# Patient Record
Sex: Male | Born: 1935
Health system: Southern US, Community
[De-identification: ages and names within clinical notes are randomized; demographics above are authoritative.]

## PROBLEM LIST (undated history)

## (undated) DIAGNOSIS — Z8679 Personal history of other diseases of the circulatory system: Secondary | ICD-10-CM

## (undated) DIAGNOSIS — Z9989 Dependence on other enabling machines and devices: Secondary | ICD-10-CM

## (undated) DIAGNOSIS — C4490 Unspecified malignant neoplasm of skin, unspecified: Secondary | ICD-10-CM

## (undated) DIAGNOSIS — I213 ST elevation (STEMI) myocardial infarction of unspecified site: Secondary | ICD-10-CM

## (undated) DIAGNOSIS — Z973 Presence of spectacles and contact lenses: Secondary | ICD-10-CM

## (undated) DIAGNOSIS — K219 Gastro-esophageal reflux disease without esophagitis: Secondary | ICD-10-CM

## (undated) DIAGNOSIS — G4733 Obstructive sleep apnea (adult) (pediatric): Secondary | ICD-10-CM

## (undated) DIAGNOSIS — I10 Essential (primary) hypertension: Secondary | ICD-10-CM

## (undated) DIAGNOSIS — E119 Type 2 diabetes mellitus without complications: Secondary | ICD-10-CM

## (undated) DIAGNOSIS — N400 Enlarged prostate without lower urinary tract symptoms: Secondary | ICD-10-CM

## (undated) DIAGNOSIS — I440 Atrioventricular block, first degree: Secondary | ICD-10-CM

## (undated) DIAGNOSIS — K5909 Other constipation: Secondary | ICD-10-CM

## (undated) DIAGNOSIS — E785 Hyperlipidemia, unspecified: Secondary | ICD-10-CM

## (undated) DIAGNOSIS — I2510 Atherosclerotic heart disease of native coronary artery without angina pectoris: Secondary | ICD-10-CM

## (undated) DIAGNOSIS — Z87898 Personal history of other specified conditions: Secondary | ICD-10-CM

## (undated) DIAGNOSIS — M1990 Unspecified osteoarthritis, unspecified site: Secondary | ICD-10-CM

## (undated) DIAGNOSIS — I4510 Unspecified right bundle-branch block: Secondary | ICD-10-CM

## (undated) HISTORY — PX: CORONARY ANGIOPLASTY WITH STENT PLACEMENT: SHX49

## (undated) HISTORY — PX: COLONOSCOPY: SHX174

## (undated) HISTORY — DX: Essential (primary) hypertension: I10

## (undated) HISTORY — PX: TONSILLECTOMY: SUR1361

## (undated) HISTORY — PX: SKIN CANCER EXCISION: SHX779

## (undated) HISTORY — DX: Atherosclerotic heart disease of native coronary artery without angina pectoris: I25.10

## (undated) HISTORY — PX: CARDIAC CATHETERIZATION: SHX172

---

## 1997-10-26 ENCOUNTER — Other Ambulatory Visit: Admission: RE | Admit: 1997-10-26 | Discharge: 1997-10-26 | Payer: Self-pay | Admitting: Family Medicine

## 2002-09-20 ENCOUNTER — Encounter: Payer: Self-pay | Admitting: Internal Medicine

## 2002-09-20 ENCOUNTER — Ambulatory Visit (HOSPITAL_COMMUNITY): Admission: RE | Admit: 2002-09-20 | Discharge: 2002-09-20 | Payer: Self-pay | Admitting: Internal Medicine

## 2002-09-20 HISTORY — PX: CARDIOVASCULAR STRESS TEST: SHX262

## 2006-02-28 ENCOUNTER — Inpatient Hospital Stay (HOSPITAL_COMMUNITY): Admission: EM | Admit: 2006-02-28 | Discharge: 2006-03-04 | Payer: Self-pay | Admitting: *Deleted

## 2006-02-28 DIAGNOSIS — I213 ST elevation (STEMI) myocardial infarction of unspecified site: Secondary | ICD-10-CM

## 2006-02-28 HISTORY — DX: ST elevation (STEMI) myocardial infarction of unspecified site: I21.3

## 2007-07-31 ENCOUNTER — Encounter: Admission: RE | Admit: 2007-07-31 | Discharge: 2007-07-31 | Payer: Self-pay | Admitting: Family Medicine

## 2008-08-25 IMAGING — US US ABDOMEN COMPLETE
1 series · 14 of 25 positions shown · non-contrast
Comparison: none

CLINICAL DATA: Low platelet count.  Evaluate for splenomegaly. 
 ABDOMEN ULTRASOUND:
TECHNIQUE: Complete abdominal ultrasound examination was performed including evaluation of the liver, gallbladder, bile ducts, pancreas, kidneys, spleen, IVC, and abdominal aorta.

[Series 1: unknown · 0.27mm/px · 14 of 81 slices shown]
[im 1/81]
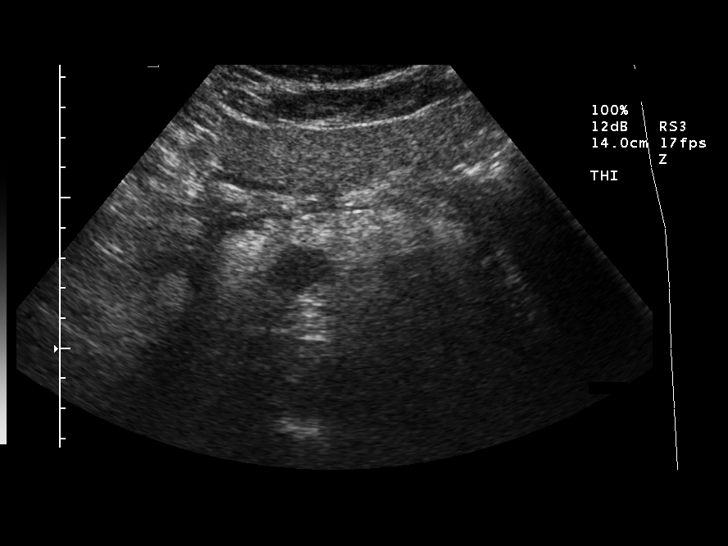
[im 7/81]
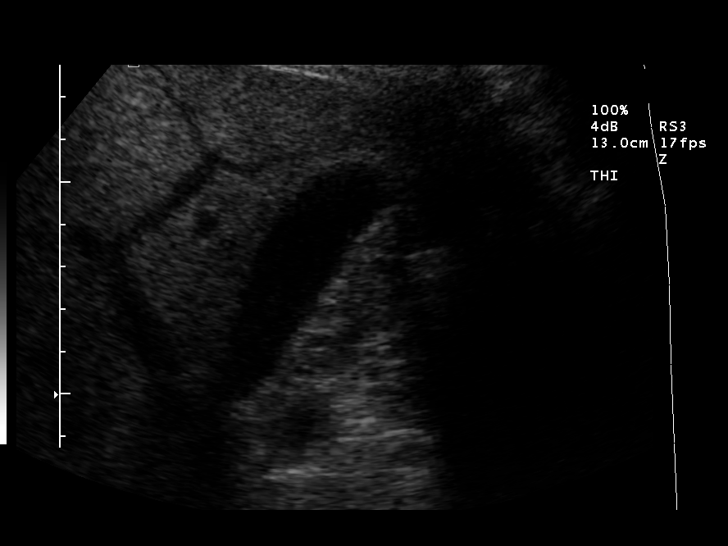
[im 14/81]
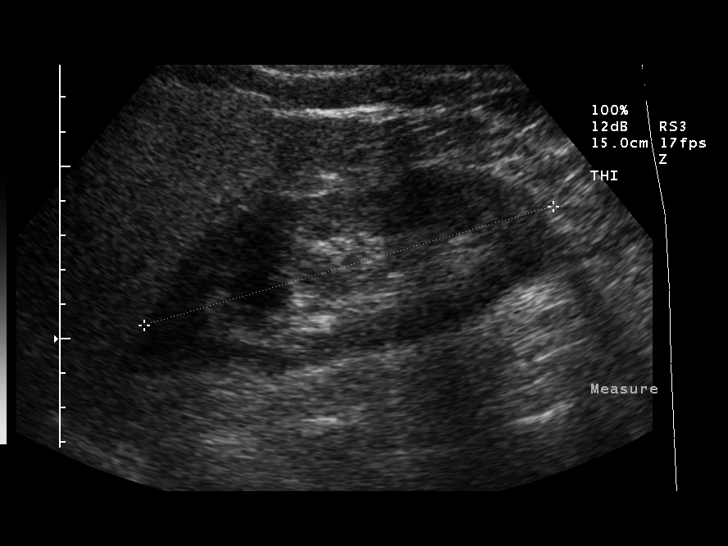
[im 21/81]
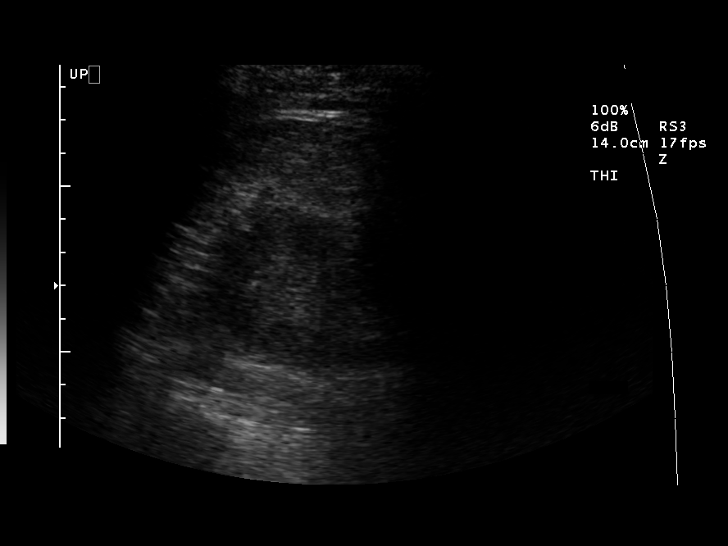
[im 27/81]
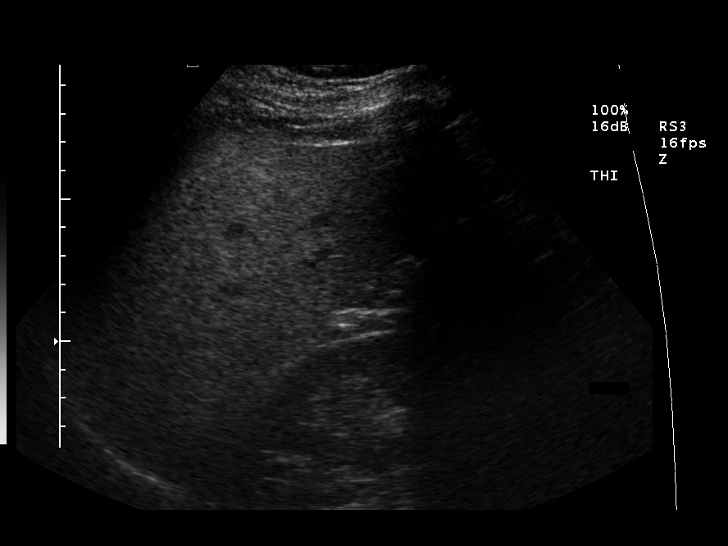
[im 31/81]
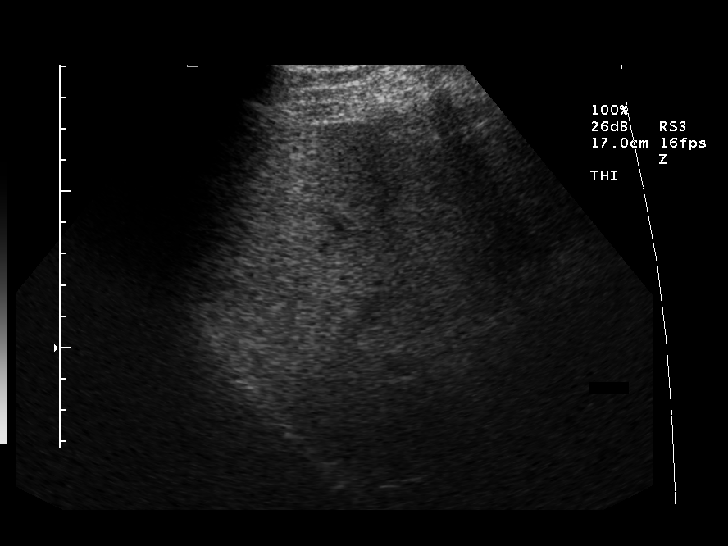
[im 37/81]
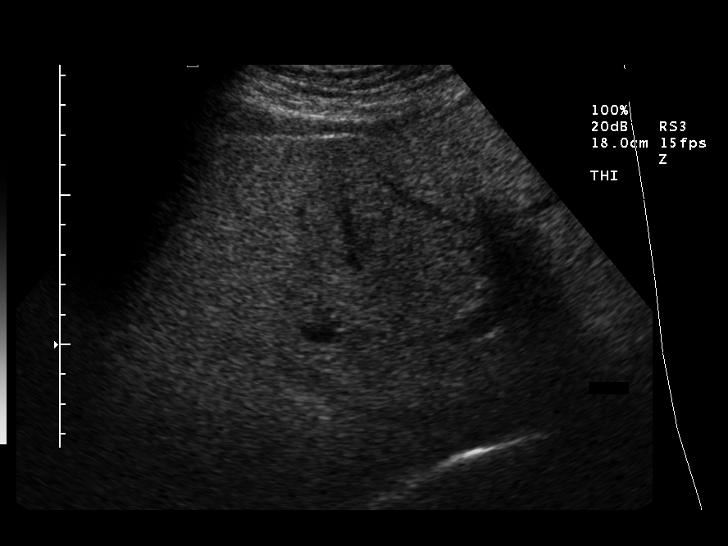
[im 44/81]
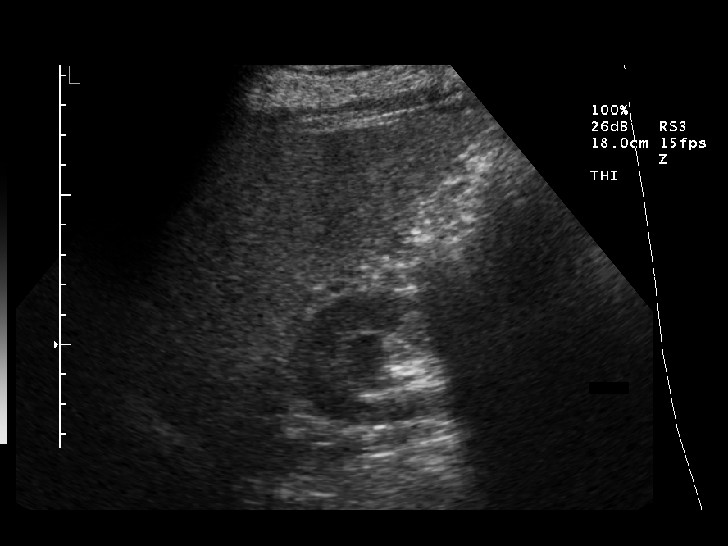
[im 51/81]
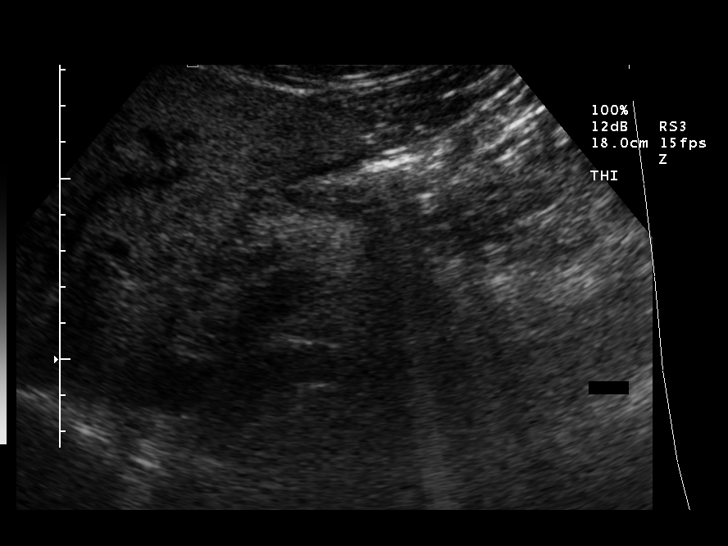
[im 54/81]
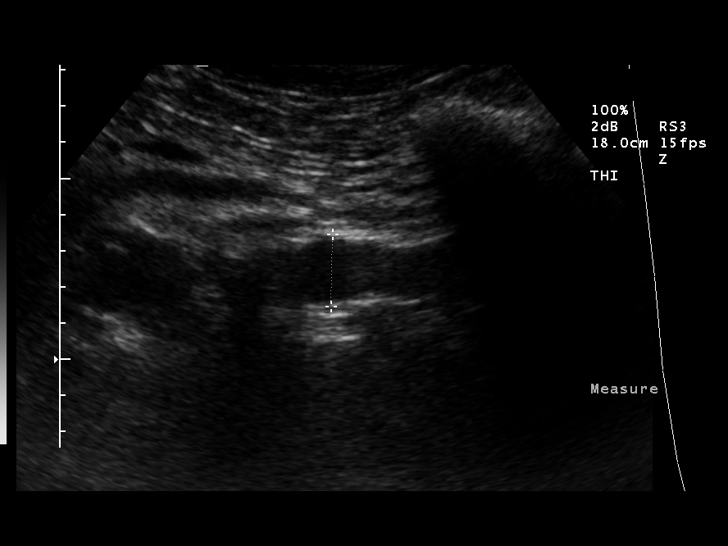
[im 61/81]
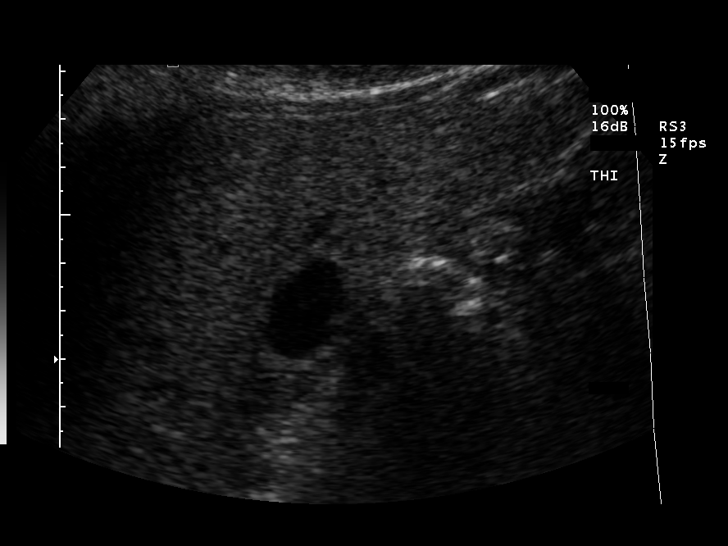
[im 67/81]
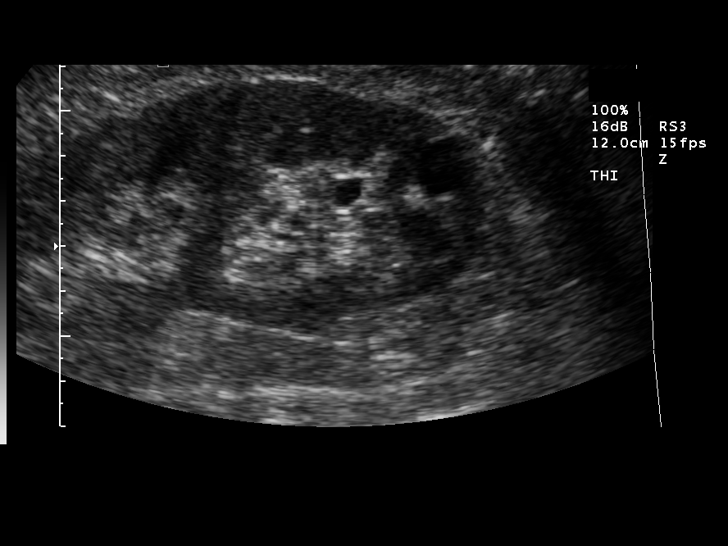
[im 74/81]
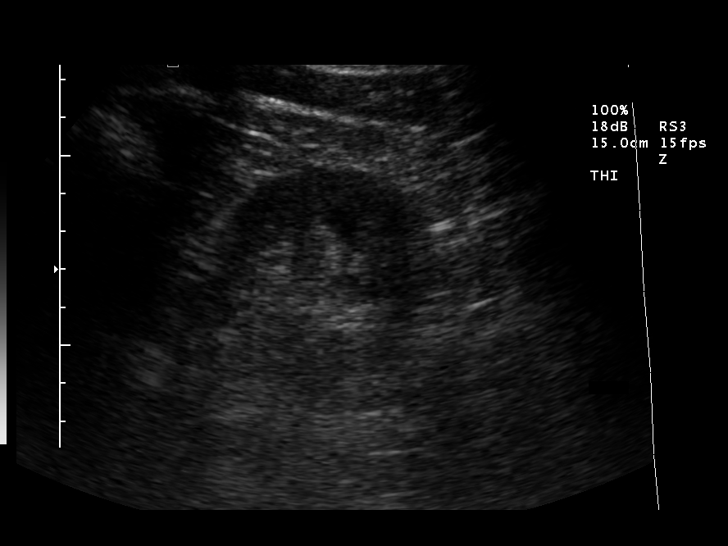
[im 81/81]
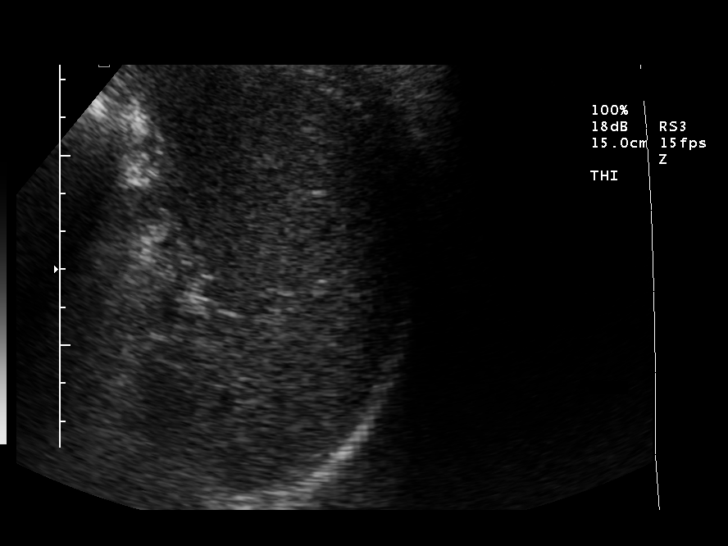

[14 of 25 positions shown; findings below may reference images not displayed]

FINDINGS: The gallbladder is well seen and no gallstones are noted.  The liver is echogenic consistent with fatty infiltration.  The common bile duct is normal measuring 4.1 mm in diameter.  The IVC and pancreas are obscured by bowel gas with only the mid portion of the pancreas being visualized and appearing normal.  The spleen is within normal limits in size.  No hydronephrosis is seen.  The right kidney measures 12.3 cm sagittally with the left kidney measuring 14.0 cm.  Small renal cysts are noted.  The abdominal aorta is normal in caliber and the common iliac arteries are obscured by gas.
IMPRESSION: 1.  No gallstones. 
 2.  Fatty infiltration of the liver. 
 3.  Bowel gas obscures the pancreatic tail and proximal iliac arteries.

## 2010-11-02 NOTE — Discharge Summary (Signed)
NAMECHADRIC, Eric Carter NO.:  [id number]   MEDICAL RECORD NO.:  [id number]          PATIENT TYPE:  INP   LOCATION:  2005                         FACILITY:  [facility]   PHYSICIAN:  Lyn Records, M.D.   DATE OF BIRTH:  04/15/1932   DATE OF ADMISSION:  02/28/2006  DATE OF DISCHARGE:  03/04/2006                                 DISCHARGE SUMMARY   DISCHARGE DIAGNOSES:  1. ST elevated myocardial bridging, inferior, status post angioplasty to      the right coronary artery.  2. __________  procedure atrial fibrillation, return to normal sinus      rhythm within 48 hours.  3. Amiodarone treatment.  4. Dyslipidemia.   HOSPITAL COURSE:  Mr. Kadrmas is a 77 year old male patient who was admitted  to [facility] hospital on February 28, 2006 after a 2 to 3-day history of  bilateral arm pain, which he thought was due to heavy lifting.  The pain  then progressed to the back and chest pain.  The pain worsened over the 24  hours prior to his admission, and upon waking on a February 28, 2006, he  went to see his primary care physician, Dr. Kathrynn Humble, and EMS was called.  The patient was brought to the [facility] emergency room.  The EKG showed ST-  segment elevation in the inferior leads approximately 1 mm.  Troponin on  admission was 3.7.   The patient was then taken to the cardiac catheterization lab emergently on  the same day, and the following was found:  Left main angiography normal,  circumflex angiographically normal, ramus small, LAD 50% mid-lesion with  heavy calcification, RCA and proximal occlusion of left and right lateral.  Dr. Katrinka Blazing had performed an angioplasty of the right coronary artery,  reducing the obstructive lesion to less than 1% post-procedure with TIMI III  flow.  The LV branch was occluded secondary to __________ .  The patient  otherwise tolerated the procedure well with the exception of developing  atrial fibrillation during the case.  The patient was  started on amiodarone  along with his beta blocker and did convert back to normal sinus rhythm  prior to discharge.  The patient was monitored in the hospital the next  several days and remained stable.  By a March 04, 2006, the patient was  ready for discharge to home.   He was discharged home in stable condition.   LABORATORY:  During his hospital stay showed hemoglobin of 13.3, hematocrit  38.0, D-dimer 0.26, sodium 143, potassium 3.9, BUN 15, creatinine 1.2, total  cholesterol 155, triglycerides 97, LDL 97, HDL 39.  EKG showed T-waves  inferiorly, and the patient has a right bundle branch block.   DISCHARGE MEDICATIONS:  1. Enteric-coated aspirin 325 mg a day.  2. Zocor 20 mg a day.  3. Sublingual nitroglycerin p.r.n. chest pain.  4. Plavix 75 mg a day.  5. Amiodarone 200 mg a day.   No driving for 2 days, no lifting over 10 pounds for one week.  __________  activity as instructed by cardiac rehabilitation, renal  low fat diet.  Follow with Dr. Katrinka Blazing on March 18, 2006 at 4:00 p.m.  Call for any  questions or concerns.      Guy Franco, P.A.      Lyn Records, M.D.  Electronically Signed    LB/MEDQ  D:  04/09/2006  T:  04/10/2006  Job:  [id number]

## 2010-11-02 NOTE — Cardiovascular Report (Signed)
NAMEJOAOVICTOR, Eric Carter NO.:  [id number]   MEDICAL RECORD NO.:  [id number]          PATIENT TYPE:  INP   LOCATION:  2807                         FACILITY:  [facility]   PHYSICIAN:  Lyn Records, M.D.   DATE OF BIRTH:  January 02, 1932   DATE OF PROCEDURE:  02/28/2006  DATE OF DISCHARGE:                              CARDIAC CATHETERIZATION   CARDIAC CATHETERIZATION AND EMERGENCY ANGIOPLASTY NOTE   INDICATION:  Myocardial infarction 48-72 hours old with ongoing chest  discomfort in a previously healthy 77 year old gentleman.   PROCEDURE PERFORMED:  1. Left heart catheterization.  2. Coronary angioplasty.  3. Left ventriculography.  4. Angioplasty of the right coronary.  5. Thrombectomy using export catheter.  6. AngioSeal closure of the cath site.   DESCRIPTION:  After evaluating the patient in the emergency room, he was  brought to the cardiac cath lab under emergency circumstances.  He began  having chest pain 3 days prior to admission.  His EKG demonstrated inferior  infarction with evolutionary changes including Q waves and inverted T-waves  of minimal ST-elevation currently present.  We discussed the concept of  emergency catheterization and possible percutaneous intervention depending  upon the anatomy found.  He understood the risks including bleeding, death,  emergency surgery, stroke, renal insufficiency, allergy, among others.  Alternative therapies including medical therapy and a coronary bypass  surgery were discussed.  The patient accepted these risks.  We proceeded to  the cath lab where using a 6-French sheath arterial access was achieved  using the modified Seldinger technique.  A 6-French A2 multipurpose catheter  was then used for hemodynamic recordings, left ventriculography by hand  injection, selective left and right coronary angiography.  The right  coronary was totally occluded.   We gave weight-based heparin, IV Integrilin double bolus followed  by an  infusion, and PCI was then performed using an XBJR guide catheter and cougar  guide wires.  We crossed the total occlusion in the right coronary, used an  export catheter to aspirate probable thrombus and got reperfusion.  This  left a large amount of thrombus present.  Several additional export runs  were made, and then balloon angioplasty using a 3.0 x 20 mm long Maverick  balloon were performed from the mid vessel to the proximal vessel.  We were  still left with considerable clot, and additional export runs were made  going distally and aspirating as we withdrew.  This pretty much cleaned out  the mid and proximal vessel, however, there was embolization of thrombus  burden into the distal right coronary.  We were able to chase this further  with the guide wire and intracoronary verapamil 150 mcg.  We did final  angiography demonstrating recanalization of the right coronary with less  than 40% stenosis.  TIMI grade 3 flow was noted and there was abrupt cut off  at the distal margin of the left ventricular branch which is where most of  the thrombus burden ended.   Angio-Seal was used for closure with good hemostasis.   RESULTS:  1. Hemodynamic data.  a.     Aortic pressure 112/78.      b.     Left ventricular pressure 113/28.  2. Left ventriculogram:  Left ventriculogram was performed using hand      injection.  There was severe inferior wall hypokinesis.  EF greater      than 50%.  3. Coronary angiography.      a.     Left main coronary widely patent.      b.     Left anterior descending coronary:  Large vessel wraps around       the left ventricular apex, gives origin to a large diagonal.  Diagonal       contains 50% ostial narrowing.  Irregularities are noted in the LAD       and the mid LAD where the second diagonal rises has a 50% narrowing.       No high-grade obstruction is noted in the LAD.      c.     Circumflex artery:  Circumflex is a large vessel giving  origin       to 2 obtuse marginal branches.  Irregularities are noted but no       significant obstruction is seen.      d.     A small ramus intermedius branch arises from the left main and       is free of significant obstruction.      e.     Right coronary:  Totally occluded proximally.  The conus branch       arises before the total occlusion.  The distal vessel is seen to fill       by left-to-right collaterals on the left coronary injections.  4. Percutaneous coronary intervention:  Successful recanalization of the      right coronary using angioplasty and export thrombus      suction/thrombectomy with reduction in stenosis from 100% to 40% with      TIMI grade 3 flow.  There was persistent distal embolus in a distal      left ventricular branch.   CONCLUSION:  1. Inferior myocardial infarction 48-72 hours old with ongoing pain,      probably due to persistent collaterals prolonging the patient's      infarction.  2. Successful angioplasty of the right coronary with reduction in stenosis      from 100% percent to less than 40% with TIMI grade 3 flow.  3. Inferior akinesis.  4. Development of atrial fibrillation during the case.  5. Mild left coronary disease as outlined above.   PLAN:  1. Integrilin x48 hours.  2. Aspirin and Plavix.  3. Beta blocker therapy.  4. May need amnio if the patient does not have spontaneous conversion of      the atrial fibrillation to normal sinus rhythm.  5. May need antithrombotic therapy if atrial fibrillation persists.      Lyn Records, M.D.  Electronically Signed     HWS/MEDQ  D:  02/28/2006  T:  03/01/2006  Job:  [id number]   cc:   Lianne Bushy, M.D.

## 2011-09-05 ENCOUNTER — Emergency Department (HOSPITAL_COMMUNITY)
Admission: EM | Admit: 2011-09-05 | Discharge: 2011-09-05 | Disposition: A | Discharge status: Home / Self Care | Payer: Medicare Other | Attending: Emergency Medicine | Admitting: Emergency Medicine

## 2011-09-05 ENCOUNTER — Encounter (HOSPITAL_COMMUNITY): Payer: Self-pay | Admitting: Emergency Medicine

## 2011-09-05 DIAGNOSIS — I252 Old myocardial infarction: Secondary | ICD-10-CM | POA: Insufficient documentation

## 2011-09-05 DIAGNOSIS — R339 Retention of urine, unspecified: Secondary | ICD-10-CM | POA: Insufficient documentation

## 2011-09-05 DIAGNOSIS — E119 Type 2 diabetes mellitus without complications: Secondary | ICD-10-CM | POA: Insufficient documentation

## 2011-09-05 DIAGNOSIS — Z79899 Other long term (current) drug therapy: Secondary | ICD-10-CM | POA: Insufficient documentation

## 2011-09-05 LAB — POCT I-STAT, CHEM 8
BUN: 16 mg/dL (ref 6–23)
Creatinine, Ser: 1.1 mg/dL (ref 0.50–1.35)
Glucose, Bld: 154 mg/dL — ABNORMAL HIGH (ref 70–99)
Hemoglobin: 16.7 g/dL (ref 13.0–17.0)
Potassium: 4 mEq/L (ref 3.5–5.1)
TCO2: 22 mmol/L (ref 0–100)

## 2011-09-05 LAB — URINALYSIS, ROUTINE W REFLEX MICROSCOPIC
Bilirubin Urine: NEGATIVE
Hgb urine dipstick: NEGATIVE
Protein, ur: NEGATIVE mg/dL
Urobilinogen, UA: 0.2 mg/dL (ref 0.0–1.0)

## 2011-09-05 NOTE — Discharge Instructions (Signed)
Return to the ED with any concerns including decreased urine output, fever/chills, abdominal pain, vomiting, decreased level of alertness/lethargy, or any other alarming symptoms

## 2011-09-05 NOTE — ED Provider Notes (Signed)
History     CSN: [id number]  Arrival date & time 09/05/11  2841   First MD Initiated Contact with Patient 09/05/11 2021      Chief Complaint  Patient presents with  . Urinary Retention    (Consider location/radiation/quality/duration/timing/severity/associated sxs/prior treatment) HPI Pt presents with urinary retention.  He states that he had a prostate biopsy several days ago and has had decreased urinary flow, but this has been decreasing until today he could not urinate at all.  C/o lower abdominal pain.  No fever/chills, no other associated systemic symptoms.  Palpation makes pain worse, there are no other alleviating or modifying factors.  No vomiting.  Pain is constant and aching.   Past Medical History  Diagnosis Date  . Urinary retention   . Diabetes mellitus   . Myocardial infarct, old   . Prostate enlargement     awaiting biopsy results    Past Surgical History  Procedure Date  . Cardiac catheterization   . Tonsillectomy     History reviewed. No pertinent family history.  History  Substance Use Topics  . Smoking status: Never Smoker   . Smokeless tobacco: Not on file  . Alcohol Use: No      Review of Systems ROS reviewed and otherwise negative except for mentioned in HPI   Allergies  Review of patient's allergies indicates no known allergies.  Home Medications   Current Outpatient Rx  Name Route Sig Dispense Refill  . BETHANECHOL CHLORIDE 25 MG PO TABS Oral Take 25 mg by mouth 4 (four) times daily.    Marland Kitchen EZETIMIBE 10 MG PO TABS Oral Take 10 mg by mouth at bedtime.    Marland Kitchen GLIMEPIRIDE 4 MG PO TABS Oral Take 4 mg by mouth daily before breakfast.    . METFORMIN HCL ER (MOD) 500 MG PO TB24 Oral Take 1,000 mg by mouth 2 (two) times daily with a meal.    . OMEPRAZOLE 20 MG PO CPDR Oral Take 20 mg by mouth daily.    Marland Kitchen SIMVASTATIN 40 MG PO TABS Oral Take 40 mg by mouth every evening.    Marland Kitchen NAPROXEN SODIUM 550 MG PO TABS Oral Take 550 mg by mouth 2 (two) times  daily as needed.      BP 173/91  Pulse 73  Temp(Src) 98.6 F (37 C) (Oral)  Resp 20  SpO2 100% Vitals reviewed Physical Exam Physical Examination: General appearance - alert, well appearing, and in no distress Mental status - alert, oriented to person, place, and time Mouth - mucous membranes moist, pharynx normal without lesions Chest - clear to auscultation, no wheezes, rales or rhonchi, symmetric air entry Heart - normal rate, regular rhythm, normal S1, S2, no murmurs, rubs, clicks or gallops Abdomen - soft, nontender, nondistended, no masses or organomegaly, nabs- after foley catheter placed GU Male - no penile lesions or discharge, no testicular masses or tenderness, no hernias, foley catheter in place- draining clear yellow urine Extremities - peripheral pulses normal, no pedal edema, no clubbing or cyanosis Skin - normal coloration and turgor, no rashes  ED Course  Procedures (including critical care time) 9:42 PM Pt has had foley catheter placed with approx 500 cc out of clear yellow urine.  Feels much improved.    Labs Reviewed  POCT I-STAT, CHEM 8 - Abnormal; Notable for the following:    Glucose, Bld 154 (*)    All other components within normal limits  URINALYSIS, ROUTINE W REFLEX MICROSCOPIC  LAB REPORT - SCANNED  No results found.   1. Urinary retention       MDM  Pt presenting with acute urinary retention- resolved with placement of foley catheter- BUN/creat normal, urinalysis reassuring as well.  Pt feels much improved.  Discharged with foley to f/u with urology on an outpatient basis. Pt given strict return precautions and is agreeable with this plan.         Ethelda Chick, MD 09/07/11 [phone]

## 2011-09-05 NOTE — ED Notes (Signed)
Patient reports tha the had a prostate biopsy and the patient reports that since yesterday he has had trouble urinating.

## 2013-01-08 ENCOUNTER — Encounter: Payer: Self-pay | Admitting: Internal Medicine

## 2013-04-20 ENCOUNTER — Encounter: Payer: Self-pay | Admitting: Internal Medicine

## 2013-06-17 HISTORY — PX: CATARACT EXTRACTION W/ INTRAOCULAR LENS  IMPLANT, BILATERAL: SHX1307

## 2013-06-18 ENCOUNTER — Ambulatory Visit (AMBULATORY_SURGERY_CENTER): Payer: Self-pay

## 2013-06-18 VITALS — Ht 72.0 in | Wt 225.0 lb

## 2013-06-18 DIAGNOSIS — Z800 Family history of malignant neoplasm of digestive organs: Secondary | ICD-10-CM

## 2013-06-18 MED ORDER — MOVIPREP 100 G PO SOLR: 1.0000 | Freq: Once | ORAL | Status: DC

## 2013-06-23 ENCOUNTER — Encounter: Payer: Self-pay | Admitting: Internal Medicine

## 2013-07-02 ENCOUNTER — Ambulatory Visit (AMBULATORY_SURGERY_CENTER): Payer: Medicare Other | Admitting: Internal Medicine

## 2013-07-02 ENCOUNTER — Encounter: Payer: Self-pay | Admitting: Internal Medicine

## 2013-07-02 VITALS — BP 113/67 | HR 51 | Temp 97.0°F | Resp 16 | Ht 72.0 in | Wt 225.0 lb

## 2013-07-02 DIAGNOSIS — Z800 Family history of malignant neoplasm of digestive organs: Secondary | ICD-10-CM

## 2013-07-02 DIAGNOSIS — Z1211 Encounter for screening for malignant neoplasm of colon: Secondary | ICD-10-CM

## 2013-07-02 LAB — GLUCOSE, CAPILLARY
GLUCOSE-CAPILLARY: 159 mg/dL — AB (ref 70–99)
GLUCOSE-CAPILLARY: 127 mg/dL — AB (ref 70–99)

## 2013-07-02 MED ORDER — SODIUM CHLORIDE 0.9 % IV SOLN: 500.0000 mL | INTRAVENOUS | Status: DC

## 2013-07-02 NOTE — Patient Instructions (Signed)
Normal Colonoscopy  YOU HAD AN ENDOSCOPIC PROCEDURE TODAY AT THE [facility]: Refer to the procedure report that was given to you for any specific questions about what was found during the examination.  If the procedure report does not answer your questions, please call your gastroenterologist to clarify.  If you requested that your care partner not be given the details of your procedure findings, then the procedure report has been included in a sealed envelope for you to review at your convenience later.  YOU SHOULD EXPECT: Some feelings of bloating in the abdomen. Passage of more gas than usual.  Walking can help get rid of the air that was put into your GI tract during the procedure and reduce the bloating. If you had a lower endoscopy (such as a colonoscopy or flexible sigmoidoscopy) you may notice spotting of blood in your stool or on the toilet paper. If you underwent a bowel prep for your procedure, then you may not have a normal bowel movement for a few days.  DIET: Your first meal following the procedure should be a light meal and then it is ok to progress to your normal diet.  A half-sandwich or bowl of soup is an example of a  good first meal.  Heavy or fried foods are harder to digest and may make you feel nauseous or bloated.  Likewise meals heavy in dairy and vegetables can cause extra gas to form and this can also increase the bloating.  Drink plenty of fluids but you should avoid alcoholic beverages for 24 hours.  ACTIVITY: Your care partner should take you home directly after the procedure.  You should plan to take it easy, moving slowly for the rest of the day.  You can resume normal activity the day after the procedure however you should NOT DRIVE or use heavy machinery for 24 hours (because of the sedation medicines used during the test).    SYMPTOMS TO REPORT IMMEDIATELY: A gastroenterologist can be reached at any hour.  During normal business hours, 8:30 AM to 5:00 PM  Monday through Friday, call [phone].  After hours and on weekends, please call the GI answering service at [phone] who will take a message and have the physician on call contact you.   Following lower endoscopy (colonoscopy or flexible sigmoidoscopy):  Excessive amounts of blood in the stool  Significant tenderness or worsening of abdominal pains  Swelling of the abdomen that is new, acute  Fever of 100F or higher  FOLLOW UP: If any biopsies were taken you will be contacted by phone or by letter within the next 1-3 weeks.  Call your gastroenterologist if you have not heard about the biopsies in 3 weeks.  Our staff will call the home number listed on your records the next business day following your procedure to check on you and address any questions or concerns that you may have at that time regarding the information given to you following your procedure. This is a courtesy call and so if there is no answer at the home number and we have not heard from you through the emergency physician on call, we will assume that you have returned to your regular daily activities without incident.  SIGNATURES/CONFIDENTIALITY: You and/or your care partner have signed paperwork which will be entered into your electronic medical record.  These signatures attest to the fact that that the information above on your After Visit Summary has been reviewed and is understood.  Full responsibility of   confidentiality of this discharge information lies with you and/or your care-partner. 

## 2013-07-02 NOTE — Op Note (Signed)
[facility]  [address] [place], [address]   COLONOSCOPY PROCEDURE REPORT  PATIENT: Eric, Carter  MR#: [id number] BIRTHDATE: Jan 17, 1932 , 81  yrs. old GENDER: Male ENDOSCOPIST: Lafayette Dragon, MD REFERRED [id number] Hamrick, M.D. PROCEDURE DATE:  07/02/2013 PROCEDURE:   Colonoscopy, screening First Screening Colonoscopy - Avg.  risk and is 50 yrs.  old or older - No.  Prior Negative Screening - Now for repeat screening. 10 or more years since last screening  History of Adenoma - Now for follow-up colonoscopy & has been > or = to 3 yrs.  N/A  Polyps Removed Today? No.  Recommend repeat exam, <10 yrs? No. ASA CLASS:   Class III INDICATIONS:Patient's immediate family history of colon cancer and last colonoscopy in 2004 was normal. MEDICATIONS: MAC sedation, administered by CRNA and propofol (Diprivan) 150mg  IV  DESCRIPTION OF PROCEDURE:   After the risks benefits and alternatives of the procedure were thoroughly explained, informed consent was obtained.  A digital rectal exam revealed no abnormalities of the rectum.   The LB [id number]  endoscope was introduced through the anus and advanced to the cecum, which was identified by both the appendix and ileocecal valve. No adverse events experienced.   The quality of the prep was good, using MoviPrep  The instrument was then slowly withdrawn as the colon was fully examined.      COLON FINDINGS: A normal appearing cecum, ileocecal valve, and appendiceal orifice were identified.  The ascending, hepatic flexure, transverse, splenic flexure, descending, sigmoid colon and rectum appeared unremarkable.  No polyps or cancers were seen. Retroflexed views revealed no abnormalities. The time to cecum=minutes 0 seconds.  Withdrawal time=7 minutes 15 seconds. The scope was withdrawn and the procedure completed. COMPLICATIONS: There were no complications.  ENDOSCOPIC IMPRESSION: Normal  colon  RECOMMENDATIONS: high fiber diet No recall of due to age   eSigned:  Lafayette Dragon, MD 07/02/2013 11:21 AM   cc:   PATIENT NAME:  Eric, Carter MR#: [id number]

## 2013-07-02 NOTE — Progress Notes (Signed)
A/ox3 pleased with MAC, report to Karol RN 

## 2013-07-05 ENCOUNTER — Telehealth: Payer: Self-pay

## 2013-07-05 NOTE — Telephone Encounter (Signed)
  Follow up Call-  Call back number 07/02/2013  Post procedure Call Back phone  # [phone]  Permission to leave phone message Yes     Patient questions:  Do you have a fever, pain , or abdominal swelling? no Pain Score  0 *  Have you tolerated food without any problems? yes  Have you been able to return to your normal activities? yes  Do you have any questions about your discharge instructions: Diet   no Medications  no Follow up visit  no  Do you have questions or concerns about your Care? no  Actions: * If pain score is 4 or above: No action needed, pain <4.  The pt was not available.  I spoke with his wife.  She said he had small amount bright, red blood in a bowel movement Saturday night.  Have not seen any more blood since that time. I advised her to tell him we called and if any more bleeding and he has questions to call us back. maw

## 2013-07-09 ENCOUNTER — Encounter: Payer: Self-pay | Admitting: Cardiology

## 2013-07-09 DIAGNOSIS — I25119 Atherosclerotic heart disease of native coronary artery with unspecified angina pectoris: Secondary | ICD-10-CM | POA: Insufficient documentation

## 2013-07-09 DIAGNOSIS — E785 Hyperlipidemia, unspecified: Secondary | ICD-10-CM | POA: Insufficient documentation

## 2013-07-09 DIAGNOSIS — I2510 Atherosclerotic heart disease of native coronary artery without angina pectoris: Secondary | ICD-10-CM

## 2013-07-09 DIAGNOSIS — I10 Essential (primary) hypertension: Secondary | ICD-10-CM

## 2013-07-09 DIAGNOSIS — E7800 Pure hypercholesterolemia, unspecified: Secondary | ICD-10-CM

## 2013-07-09 DIAGNOSIS — K2100 Gastro-esophageal reflux disease with esophagitis, without bleeding: Secondary | ICD-10-CM | POA: Insufficient documentation

## 2013-07-13 ENCOUNTER — Encounter: Payer: Self-pay | Admitting: Interventional Cardiology

## 2013-07-13 ENCOUNTER — Ambulatory Visit (INDEPENDENT_AMBULATORY_CARE_PROVIDER_SITE_OTHER): Payer: Medicare Other | Admitting: Interventional Cardiology

## 2013-07-13 VITALS — BP 126/80 | HR 58 | Ht 72.0 in | Wt 229.0 lb

## 2013-07-13 DIAGNOSIS — I10 Essential (primary) hypertension: Secondary | ICD-10-CM

## 2013-07-13 DIAGNOSIS — I2510 Atherosclerotic heart disease of native coronary artery without angina pectoris: Secondary | ICD-10-CM

## 2013-07-13 DIAGNOSIS — M79606 Pain in leg, unspecified: Secondary | ICD-10-CM

## 2013-07-13 DIAGNOSIS — M79609 Pain in unspecified limb: Secondary | ICD-10-CM

## 2013-07-13 DIAGNOSIS — E7800 Pure hypercholesterolemia, unspecified: Secondary | ICD-10-CM

## 2013-07-13 NOTE — Patient Instructions (Signed)
HOLD PRAVASTATIN FOR 6 WEEKS CALL THE OFFICE AND LET us KNOW IF YOUR MUSCLE PAIN HAS IMPROVED. [phone]  STAY ON ALL OTHER MEDICATIONS AS PRESCRIBED   Your physician wants you to follow-up in: [place] will receive a reminder letter in the mail two months in advance. If you don't receive a letter, please call our office to schedule the follow-up appointment.

## 2013-07-13 NOTE — Progress Notes (Signed)
Patient ID: Eric Carter, male   DOB: [birth year], 80 y.o.   MRN: [id number] Past Medical History  CAD with IMI in 2007, RCA angioplasty   Diabetes Mellitus   Hypertension, not present 2014      [address] [place], [place]  [address] Phone: [phone] Fax:  [phone]  Date:  07/13/2013   ID:  Eric Carter, DOB [birth year], MRN [id number]  PCP:  Leonides Sake, MD   ASSESSMENT:  1. Coronary artery disease without angina. Remote history of right coronary PTCA during acute inferior myocardial infarction 2. Hyperlipidemia on Pravastatin 40 mg per day.  3. Right greater than left leg discomfort that the patient feels may be related to statin therapy. 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 4. Hypertension, clinically controlled   PLAN:  1. Hold pravastatin for 6 weeks. Call in the a.m. and that time a local with a report about muscle soreness. If it has resolved we will make changes in lipid therapy. If he continues to be a problem resume lipid therapy and he will work with his primary care physician to figure out why the legs are uncomfortable. He has excellent pulses and the symptoms are not compatible with claudication. 2. Clinical followup in one year for coronary artery disease 3. I recommended an active lifestyle as much as possible   SUBJECTIVE: Eric Carter is a 80 y.o. male who is doing well without anginal complaints. He denies nitroglycerin use. There is no exertional chest discomfort or dyspnea. He does complain of bilateral thigh discomfort right greater than left. This is particularly troubling when he goes from a sitting to a standing position. He has no history of lumbar disc disease that he is aware of. He denies orthopnea, PND, exertional leg discomfort, edema, and palpitations. No transient neurological complaints.   Wt Readings from Last 3 Encounters:  07/13/13 229 lb (103.874 kg)  07/02/13 225 lb (102.059 kg)  06/18/13 225 lb (102.059 kg)     Past  Medical History  Diagnosis Date  . Urinary retention   . Diabetes mellitus   . Myocardial infarct, old   . Prostate enlargement     awaiting biopsy results  . Coronary artery disease     IMI in 2007, RCA angioplasty  . Hypertension     not present 2014    Current Outpatient Prescriptions  Medication Sig Dispense Refill  . aspirin EC 325 MG tablet Take 325 mg by mouth daily.      . bethanechol (URECHOLINE) 25 MG tablet Take 25 mg by mouth 5 (five) times daily.       Marland Kitchen ezetimibe (ZETIA) 10 MG tablet Take 10 mg by mouth at bedtime.      Marland Kitchen glimepiride (AMARYL) 4 MG tablet Take 4 mg by mouth 2 (two) times daily.       . metFORMIN (GLUMETZA) 500 MG (MOD) 24 hr tablet Take 1,000 mg by mouth 2 (two) times daily with a meal.      . Omega-3 Fatty Acids (FISH OIL) 1000 MG CAPS Take 4 capsules by mouth daily.      Marland Kitchen omeprazole (PRILOSEC) 20 MG capsule Take 20 mg by mouth daily.      . pioglitazone (ACTOS) 30 MG tablet Take 30 mg by mouth daily.      . pravastatin (PRAVACHOL) 40 MG tablet Take 1 tablet by mouth daily.       No current facility-administered medications for this visit.    Allergies:    Allergies  Allergen Reactions  . Codeine     Wife reports "changes his whole personality"  . Pravastatin Sodium     Myalgias  . Simvastatin Other (See Comments)    Myalgias    Social History:  The patient  reports that he has never smoked. He uses smokeless tobacco. He reports that he drinks alcohol. He reports that he does not use illicit drugs.   ROS:  Please see the history of present illness.   He denies lower extremity discoloration, ulcers, and change in temperature. No abdominal pain or back discomfort. His appetite is been stable.   All other systems reviewed and negative.   OBJECTIVE: VS:  Ht 6' (1.829 m)  Wt 229 lb (103.874 kg)  BMI 31.05 kg/m2 Well nourished, well developed, in no acute distress, obese but healthy appearing  HEENT: normal Neck: JVD flat. Carotid bruit  absent  Cardiac:  normal S1, S2; RRR; no murmur Lungs:  clear to auscultation bilaterally, no wheezing, rhonchi or rales Abd: soft, nontender, no hepatomegaly Ext: Edema absent. Pulses 2+  Skin: warm and dry Neuro:  CNs 2-12 intact, no focal abnormalities noted  EKG:  Normal sinus rhythm/sinus bradycardia with right bundle branch block and old inferior MI       Signed, Illene Labrador III, MD 07/13/2013 10:21 AM

## 2014-07-22 ENCOUNTER — Ambulatory Visit (INDEPENDENT_AMBULATORY_CARE_PROVIDER_SITE_OTHER): Payer: Medicare Other | Admitting: Interventional Cardiology

## 2014-07-22 ENCOUNTER — Encounter: Payer: Self-pay | Admitting: Interventional Cardiology

## 2014-07-22 VITALS — BP 134/72 | HR 64 | Ht 72.0 in | Wt 238.6 lb

## 2014-07-22 DIAGNOSIS — I10 Essential (primary) hypertension: Secondary | ICD-10-CM

## 2014-07-22 DIAGNOSIS — I2510 Atherosclerotic heart disease of native coronary artery without angina pectoris: Secondary | ICD-10-CM

## 2014-07-22 DIAGNOSIS — E785 Hyperlipidemia, unspecified: Secondary | ICD-10-CM

## 2014-07-22 NOTE — Patient Instructions (Signed)
Your physician recommends that you continue on your current medications as directed. Please refer to the Current Medication list given to you today.  Stay Active  Please have Dr.Hamrick forward [place] a copy of your lipid labs. Please fax to [phone] attn: Dr.[email or web address]  Your physician wants you to follow-up in: 1 year with Dr.Smith You will receive a reminder letter in the mail two months in advance. If you don't receive a letter, please call our office to schedule the follow-up appointment.

## 2014-07-22 NOTE — Progress Notes (Signed)
Cardiology Office Note   Date:  07/22/2014   ID:  Eric Carter, DOB 12-Sep-1931, MRN [id number]  PCP:  Leonides Sake, MD  Cardiologist:   Sinclair Grooms, MD   No chief complaint on file.     History of Present Illness: Eric Carter is a 81 y.o. male who presents for CAD f/u and has no complaints. He does know that physical activity fatigues him more than previously. He denies orthopnea, extremity edema, and PND. He denies palpitations.    Past Medical History  Diagnosis Date  . Urinary retention   . Diabetes mellitus   . Myocardial infarct, old   . Prostate enlargement     awaiting biopsy results  . Coronary artery disease     IMI in 2007, RCA angioplasty  . Hypertension     not present 2014    Past Surgical History  Procedure Laterality Date  . Cardiac catheterization    . Tonsillectomy    . Prostate biopsy    . Cataract extraction, bilateral       Current Outpatient Prescriptions  Medication Sig Dispense Refill  . aspirin EC 325 MG tablet Take 325 mg by mouth daily.    . bethanechol (URECHOLINE) 25 MG tablet Take 25 mg by mouth 5 (five) times daily.     Marland Kitchen glimepiride (AMARYL) 4 MG tablet Take 4 mg by mouth 2 (two) times daily.     Marland Kitchen lisinopril (PRINIVIL,ZESTRIL) 2.5 MG tablet   3  . metFORMIN (GLUMETZA) 500 MG (MOD) 24 hr tablet Take 1,000 mg by mouth 2 (two) times daily with a meal.     . Omega-3 Fatty Acids (FISH OIL) 1000 MG CAPS Take 2 capsules by mouth daily.     Marland Kitchen omeprazole (PRILOSEC) 20 MG capsule Take 20 mg by mouth daily.    . pioglitazone (ACTOS) 30 MG tablet Take 30 mg by mouth daily.    . psyllium (METAMUCIL) 58.6 % packet Take 1 packet by mouth daily.    Marland Kitchen testosterone cypionate (DEPOTESTOTERONE CYPIONATE) 200 MG/ML injection Inject 200 mg into the muscle every 14 (fourteen) days.   5  . ezetimibe (ZETIA) 10 MG tablet Take 10 mg by mouth at bedtime.     No current facility-administered medications for this visit.    Allergies:   Codeine;  Pravastatin sodium; and Simvastatin    Social History:  The patient  reports that he has never smoked. He uses smokeless tobacco. He reports that he drinks alcohol. He reports that he does not use illicit drugs.   Family History:  The patient's family history includes Colon cancer in his father. There is no history of Stomach cancer.    ROS:  Please see the history of present illness.   Otherwise, review of systems are positive for fatigue.   All other systems are reviewed and negative.    PHYSICAL EXAM: VS:  BP 134/72 mmHg  Pulse 64  Ht 6' (1.829 m)  Wt 238 lb 9.6 oz (108.228 kg)  BMI 32.35 kg/m2 , BMI Body mass index is 32.35 kg/(m^2). GEN: Well nourished, well developed, in no acute distress HEENT: normal Neck: no JVD, carotid bruits, or masses Cardiac: RRR; no murmurs, rubs, or gallops,no edema  Respiratory:  clear to auscultation bilaterally, normal work of breathing GI: soft, nontender, nondistended, + BS MS: no deformity or atrophy Skin: warm and dry, no rash Neuro:  Strength and sensation are intact Psych: euthymic mood, full affect   EKG:  EKG is ordered  today. The ekg ordered today demonstrates NSR with RBBB and inferior MI.   Recent Labs: No results found for requested labs within last 365 days.    Lipid Panel No results found for: CHOL, TRIG, HDL, CHOLHDL, VLDL, LDLCALC, LDLDIRECT    Wt Readings from Last 3 Encounters:  07/22/14 238 lb 9.6 oz (108.228 kg)  07/13/13 229 lb (103.874 kg)  07/02/13 225 lb (102.059 kg)      Other studies Reviewed: Additional studies/ records that were reviewed today include: None. Review of the above records demonstrates:    ASSESSMENT AND PLAN:  1.  Coronary artery disease with right coronary angioplasty during acute inferior myocardial infarction in 2007. The patient is doing well and denies any anginal complaints. 2. Hypertension, under good control  3. Chronic diastolic heart failure with out evidence of volume  overload 4. Hyperlipidemia being managed by Dr. Lisbeth Ply, his PCP   Current medicines are reviewed at length with the patient today.  The patient does not have concerns regarding medicines.  The following changes have been made:  no change  Labs/ tests ordered today include:  No orders of the defined types were placed in this encounter.     Disposition:   FU with Linard Millers  in 1 Year   Signed, Sinclair Grooms, MD  07/22/2014 10:34 AM    [address], [place], [place]  [address] Phone: [phone]; Fax: [phone]

## 2014-09-20 DIAGNOSIS — M545 Low back pain: Secondary | ICD-10-CM | POA: Diagnosis not present

## 2014-09-20 DIAGNOSIS — M25551 Pain in right hip: Secondary | ICD-10-CM | POA: Diagnosis not present

## 2014-10-04 DIAGNOSIS — H35372 Puckering of macula, left eye: Secondary | ICD-10-CM | POA: Diagnosis not present

## 2014-10-04 DIAGNOSIS — E119 Type 2 diabetes mellitus without complications: Secondary | ICD-10-CM | POA: Diagnosis not present

## 2014-10-11 DIAGNOSIS — M47817 Spondylosis without myelopathy or radiculopathy, lumbosacral region: Secondary | ICD-10-CM | POA: Diagnosis not present

## 2014-10-11 DIAGNOSIS — M545 Low back pain: Secondary | ICD-10-CM | POA: Diagnosis not present

## 2014-10-11 DIAGNOSIS — M5416 Radiculopathy, lumbar region: Secondary | ICD-10-CM | POA: Diagnosis not present

## 2014-10-19 DIAGNOSIS — M6281 Muscle weakness (generalized): Secondary | ICD-10-CM | POA: Diagnosis not present

## 2014-10-19 DIAGNOSIS — M4806 Spinal stenosis, lumbar region: Secondary | ICD-10-CM | POA: Diagnosis not present

## 2014-10-24 DIAGNOSIS — M4806 Spinal stenosis, lumbar region: Secondary | ICD-10-CM | POA: Diagnosis not present

## 2014-10-24 DIAGNOSIS — M6281 Muscle weakness (generalized): Secondary | ICD-10-CM | POA: Diagnosis not present

## 2014-10-26 DIAGNOSIS — M6281 Muscle weakness (generalized): Secondary | ICD-10-CM | POA: Diagnosis not present

## 2014-10-26 DIAGNOSIS — M4806 Spinal stenosis, lumbar region: Secondary | ICD-10-CM | POA: Diagnosis not present

## 2014-10-31 DIAGNOSIS — M4806 Spinal stenosis, lumbar region: Secondary | ICD-10-CM | POA: Diagnosis not present

## 2014-10-31 DIAGNOSIS — M6281 Muscle weakness (generalized): Secondary | ICD-10-CM | POA: Diagnosis not present

## 2014-11-02 DIAGNOSIS — M4806 Spinal stenosis, lumbar region: Secondary | ICD-10-CM | POA: Diagnosis not present

## 2014-11-02 DIAGNOSIS — M6281 Muscle weakness (generalized): Secondary | ICD-10-CM | POA: Diagnosis not present

## 2014-11-07 DIAGNOSIS — M6281 Muscle weakness (generalized): Secondary | ICD-10-CM | POA: Diagnosis not present

## 2014-11-07 DIAGNOSIS — M4806 Spinal stenosis, lumbar region: Secondary | ICD-10-CM | POA: Diagnosis not present

## 2014-11-09 DIAGNOSIS — M6281 Muscle weakness (generalized): Secondary | ICD-10-CM | POA: Diagnosis not present

## 2014-11-09 DIAGNOSIS — M4806 Spinal stenosis, lumbar region: Secondary | ICD-10-CM | POA: Diagnosis not present

## 2014-11-15 DIAGNOSIS — M6281 Muscle weakness (generalized): Secondary | ICD-10-CM | POA: Diagnosis not present

## 2014-11-15 DIAGNOSIS — M4806 Spinal stenosis, lumbar region: Secondary | ICD-10-CM | POA: Diagnosis not present

## 2014-11-16 DIAGNOSIS — E1129 Type 2 diabetes mellitus with other diabetic kidney complication: Secondary | ICD-10-CM | POA: Diagnosis not present

## 2014-11-16 DIAGNOSIS — E782 Mixed hyperlipidemia: Secondary | ICD-10-CM | POA: Diagnosis not present

## 2014-11-17 DIAGNOSIS — M4806 Spinal stenosis, lumbar region: Secondary | ICD-10-CM | POA: Diagnosis not present

## 2014-11-17 DIAGNOSIS — M6281 Muscle weakness (generalized): Secondary | ICD-10-CM | POA: Diagnosis not present

## 2014-11-18 DIAGNOSIS — E782 Mixed hyperlipidemia: Secondary | ICD-10-CM | POA: Diagnosis not present

## 2014-11-18 DIAGNOSIS — Z1389 Encounter for screening for other disorder: Secondary | ICD-10-CM | POA: Diagnosis not present

## 2014-11-18 DIAGNOSIS — N183 Chronic kidney disease, stage 3 (moderate): Secondary | ICD-10-CM | POA: Diagnosis not present

## 2014-11-18 DIAGNOSIS — E1129 Type 2 diabetes mellitus with other diabetic kidney complication: Secondary | ICD-10-CM | POA: Diagnosis not present

## 2014-11-18 DIAGNOSIS — M654 Radial styloid tenosynovitis [de Quervain]: Secondary | ICD-10-CM | POA: Diagnosis not present

## 2014-11-18 DIAGNOSIS — Z9181 History of falling: Secondary | ICD-10-CM | POA: Diagnosis not present

## 2014-11-21 ENCOUNTER — Encounter: Payer: Self-pay | Admitting: Interventional Cardiology

## 2014-11-21 DIAGNOSIS — M6281 Muscle weakness (generalized): Secondary | ICD-10-CM | POA: Diagnosis not present

## 2014-11-21 DIAGNOSIS — M4806 Spinal stenosis, lumbar region: Secondary | ICD-10-CM | POA: Diagnosis not present

## 2014-11-23 DIAGNOSIS — M4806 Spinal stenosis, lumbar region: Secondary | ICD-10-CM | POA: Diagnosis not present

## 2014-11-23 DIAGNOSIS — M6281 Muscle weakness (generalized): Secondary | ICD-10-CM | POA: Diagnosis not present

## 2015-01-18 DIAGNOSIS — E291 Testicular hypofunction: Secondary | ICD-10-CM | POA: Diagnosis not present

## 2015-03-20 DIAGNOSIS — E782 Mixed hyperlipidemia: Secondary | ICD-10-CM | POA: Diagnosis not present

## 2015-03-20 DIAGNOSIS — E1129 Type 2 diabetes mellitus with other diabetic kidney complication: Secondary | ICD-10-CM | POA: Diagnosis not present

## 2015-03-20 DIAGNOSIS — Z79899 Other long term (current) drug therapy: Secondary | ICD-10-CM | POA: Diagnosis not present

## 2015-03-23 DIAGNOSIS — Z139 Encounter for screening, unspecified: Secondary | ICD-10-CM | POA: Diagnosis not present

## 2015-03-23 DIAGNOSIS — N183 Chronic kidney disease, stage 3 (moderate): Secondary | ICD-10-CM | POA: Diagnosis not present

## 2015-03-23 DIAGNOSIS — K219 Gastro-esophageal reflux disease without esophagitis: Secondary | ICD-10-CM | POA: Diagnosis not present

## 2015-03-23 DIAGNOSIS — E782 Mixed hyperlipidemia: Secondary | ICD-10-CM | POA: Diagnosis not present

## 2015-03-23 DIAGNOSIS — Z1389 Encounter for screening for other disorder: Secondary | ICD-10-CM | POA: Diagnosis not present

## 2015-03-23 DIAGNOSIS — E1129 Type 2 diabetes mellitus with other diabetic kidney complication: Secondary | ICD-10-CM | POA: Diagnosis not present

## 2015-04-19 DIAGNOSIS — H35372 Puckering of macula, left eye: Secondary | ICD-10-CM | POA: Diagnosis not present

## 2015-07-25 DIAGNOSIS — E782 Mixed hyperlipidemia: Secondary | ICD-10-CM | POA: Diagnosis not present

## 2015-07-25 DIAGNOSIS — E1129 Type 2 diabetes mellitus with other diabetic kidney complication: Secondary | ICD-10-CM | POA: Diagnosis not present

## 2015-07-28 ENCOUNTER — Ambulatory Visit (INDEPENDENT_AMBULATORY_CARE_PROVIDER_SITE_OTHER): Payer: Medicare Other | Admitting: Pharmacist

## 2015-07-28 ENCOUNTER — Encounter: Payer: Self-pay | Admitting: Interventional Cardiology

## 2015-07-28 ENCOUNTER — Ambulatory Visit (INDEPENDENT_AMBULATORY_CARE_PROVIDER_SITE_OTHER): Payer: Medicare Other | Admitting: Interventional Cardiology

## 2015-07-28 VITALS — BP 132/82 | HR 82 | Ht 72.0 in | Wt 235.0 lb

## 2015-07-28 DIAGNOSIS — I2510 Atherosclerotic heart disease of native coronary artery without angina pectoris: Secondary | ICD-10-CM

## 2015-07-28 DIAGNOSIS — E785 Hyperlipidemia, unspecified: Secondary | ICD-10-CM | POA: Diagnosis not present

## 2015-07-28 DIAGNOSIS — I10 Essential (primary) hypertension: Secondary | ICD-10-CM

## 2015-07-28 DIAGNOSIS — Z79899 Other long term (current) drug therapy: Secondary | ICD-10-CM

## 2015-07-28 DIAGNOSIS — G4719 Other hypersomnia: Secondary | ICD-10-CM | POA: Diagnosis not present

## 2015-07-28 DIAGNOSIS — R0683 Snoring: Secondary | ICD-10-CM

## 2015-07-28 DIAGNOSIS — E119 Type 2 diabetes mellitus without complications: Secondary | ICD-10-CM | POA: Insufficient documentation

## 2015-07-28 MED ORDER — LOSARTAN POTASSIUM 25 MG PO TABS: 25.0000 mg | ORAL_TABLET | Freq: Every day | ORAL | Status: DC

## 2015-07-28 MED ORDER — EZETIMIBE 10 MG PO TABS: 10.0000 mg | ORAL_TABLET | Freq: Every day | ORAL | Status: DC

## 2015-07-28 NOTE — Progress Notes (Deleted)
    Checks CBGs -120-140. Some lows, decreased to 1.5 and 2 tabs of glimepiride  Decrease asa to 81  Was on zetia but stopped  On prava 40 QOD - doing ok, simva 40 intolerant Was on zetia at one point - doesn't remember   Dry cough since hes been on lisinopril- has been on it for a year. Will switch to losartan

## 2015-07-28 NOTE — Patient Instructions (Signed)
Lower your dose of aspirin to 81mg  daily Pick up your prescription for Zetia 10mg  once daily Stop taking your lisinopril and pick up your prescription for losartan 25mg  once daily - monitor for resolution of cough Call [facility] in pharmacy clinic with any questions [phone]

## 2015-07-28 NOTE — Progress Notes (Signed)
Cardiology Office Note   Date:  07/28/2015   ID:  Eric Carter, DOB Apr 25, 1932, MRN [id number]  PCP:  Leonides Sake, MD  Cardiologist:  Sinclair Grooms, MD   Chief Complaint  Patient presents with  . Coronary Artery Disease      History of Present Illness: Eric Carter is a 82 y.o. male who presents for  coronary artery disease, history of acute inferior infarction treated with angioplasty of the distal right coronary , hypertension, type 2 diabetes, and hyperlipidemia.   The patient is accompanied by his wife. They're both concerned because of decreased energy an exertional endurance. According to the wife he sleeps a lot. He snores at night but not loudly and she does not believe he has apnea. He still tries ago to exercise 3 times per week. When exercising he has had no chest discomfort or excessive dyspnea. He denies orthopnea and PND. No blood in his urine or stool. He has not needed to use nitroglycerin.    Past Medical History  Diagnosis Date  . Urinary retention   . Diabetes mellitus   . Myocardial infarct, old   . Prostate enlargement     awaiting biopsy results  . Coronary artery disease     IMI in 2007, RCA angioplasty  . Hypertension     not present 2014    Past Surgical History  Procedure Laterality Date  . Cardiac catheterization    . Tonsillectomy    . Prostate biopsy    . Cataract extraction, bilateral       Current Outpatient Prescriptions  Medication Sig Dispense Refill  . aspirin EC 325 MG tablet Take 325 mg by mouth daily.    . bethanechol (URECHOLINE) 25 MG tablet Take 25 mg by mouth 5 (five) times daily.     Marland Kitchen glimepiride (AMARYL) 4 MG tablet Take 4 mg by mouth 2 (two) times daily. 1/2 tablet in the morning, 1 tablet in the evening    . lisinopril (PRINIVIL,ZESTRIL) 2.5 MG tablet Take 2.5 mg by mouth daily.   3  . metFORMIN (GLUMETZA) 500 MG (MOD) 24 hr tablet Take 1,000 mg by mouth 2 (two) times daily with a meal. One tablet in the  morning, two tablets in the evening    . Omega-3 Fatty Acids (FISH OIL) 1000 MG CAPS Take 2 capsules by mouth daily.     Marland Kitchen omeprazole (PRILOSEC) 20 MG capsule Take 20 mg by mouth daily.    . pioglitazone (ACTOS) 30 MG tablet Take 30 mg by mouth daily.    . pravastatin (PRAVACHOL) 40 MG tablet Take 1 tablet by mouth every other day.  3  . testosterone cypionate (DEPOTESTOTERONE CYPIONATE) 200 MG/ML injection Inject 200 mg into the muscle every 14 (fourteen) days.   5   No current facility-administered medications for this visit.    Allergies:   Codeine; Pravastatin sodium; and Simvastatin    Social History:  The patient  reports that he has never smoked. He uses smokeless tobacco. He reports that he drinks alcohol. He reports that he does not use illicit drugs.   Family History:  The patient's family history includes Colon cancer in his father. There is no history of Stomach cancer.    ROS:  Please see the history of present illness.   Otherwise, review of systems are positive for  Cough which he believes may be related to one of his medications, back discomfort, muscle pain, and constipation. He is also has  some difficulty with easy bruising..   All other systems are reviewed and negative.    PHYSICAL EXAM: VS:  BP 132/82 mmHg  Pulse 82  Ht 6' (1.829 m)  Wt 235 lb (106.595 kg)  BMI 31.86 kg/m2 , BMI Body mass index is 31.86 kg/(m^2). GEN: Well nourished, well developed, in no acute distress. Marked obesity. HEENT: normal Neck: no JVD, carotid bruits, or masses Cardiac: RRR.  There is no murmur, rub, or gallop. There is no edema. Respiratory:  clear to auscultation bilaterally, normal work of breathing. GI: soft, nontender, nondistended, + BS MS: no deformity or atrophy Skin: warm and dry, no rash Neuro:  Strength and sensation are intact Psych: euthymic mood, full affect   EKG:  EKG is ordered today. The ekg reveals  Normal sinus rhythm, right bundle branch block , inferior  and lateral infarct , left anterior hemiblock. There is no change compared to prior studies.   Recent Labs: No results found for requested labs within last 365 days.    Lipid Panel No results found for: CHOL, TRIG, HDL, CHOLHDL, VLDL, LDLCALC, LDLDIRECT    Wt Readings from Last 3 Encounters:  07/28/15 235 lb (106.595 kg)  07/22/14 238 lb 9.6 oz (108.228 kg)  07/13/13 229 lb (103.874 kg)      Other studies Reviewed: Additional studies/ records that were reviewed today include:  No records from outside provider or available.. The findings include  none.    ASSESSMENT AND PLAN:  1. Atherosclerosis of native coronary artery of native heart without angina pectoris  status not known but no symptoms suggestive of angina  2. Essential hypertension, benign  moderate control  3. Hyperlipidemia  followed by primary care  4. Excessive daytime sleepiness and fatigue Also snores. We will rule out sleep apnea with a sleep study.   5. Cough, possibly related to ACE inhibitor therapy  I offered to discontinue ACE inhibitor therapy today but he wants to speak with Dr. Lisbeth Ply about this first.   Current medicines are reviewed at length with the patient today.  The patient has the following concerns regarding medicines:  Possible relationship between lisinopril and cough..  The following changes/actions have been instituted:    Sleep study  Dr. Lisbeth Ply  Will receive copies of today's note and if she does bloodwork on the next office visit I would appreciate receiving and kind information.  Pharmacy consult requested by patient concerning his medication. We may need to discontinue ACE inhibitor therapy to see if cough gets better  Labs/ tests ordered today include:  No orders of the defined types were placed in this encounter.     Disposition:   FU with HS in 1 year  Signed, Sinclair Grooms, MD  07/28/2015 10:00 AM    Eric [address],  [place], [place]  [address] Phone: [phone]; Fax: [phone]

## 2015-07-28 NOTE — Progress Notes (Signed)
Patient ID: Eric Carter                 DOB: 03-04-1932, 82 yo                         MRN: [id number]     HPI: Eric Carter is a 82 y.o. male patient referred to pharmacy clinic by Dr. Tamala Julian for review of medications. PMH is significant for CAD s/p MI in 2007 and RCA angioplasty, DM2, HTN, HLD, and urinary retention.  CAD - Patient currently taking aspirin 325 mg. States he was put on this dose after MI in 2007.   Diabetes - Patient currently takes pioglitazone 30 mg daily, glimepiride 2 mg in the morning and 4 mg in the evening, and metformin 500 mg in the morning and 1000 mg in the evening. Checks blood sugars at home with CBG readings averaging 120-150s in the mornings. He states he has seen readings in the 60s but that is rare. Of note, both glimepiride and pioglitazone both have high incidences of causing hypoglycemia. When that occurred, his PCP cut back his dose of glimepiride in the morning which he reports has helped. Reports his A1c has been around 7. His diabetes is managed by Dr. Lisbeth Carter.   Hypertension - currently taking lisinopril 2.5 mg daily. He states he was put on this medication for high blood pressure and to help his kidneys. He reports having a dry cough and states he has had this since starting the medication over a year ago.   Hyperlipidemia - currently taking pravastatin 40 mg every other day and fish oil 2000 mg daily. He has tried simvastatin in the past and had muscle aches with this. He thinks he also tried Lipitor. He states he has muscle aches with pravastatin but not every day - he reports the pain is bearable. He states when he has muscle aches he will stop the pravastatin until it resolves and then resume the pravastatin. He also states he has tried Zetia 10 mg in the past but stopped taking. He says he does not remember having problems tolerating the medication and he may have stopped due to cost. Zetia is now generic and pt reports that he is willing to try it  again.   Lipid labs:  11/2014: TC 206, TG 155, HDL 36, LDL 139 (was on fenofibrate 160mg  and fish oil 2g, switched from fenofibrate to pravastatin and continued on fish oil by PCP after labs resulted) LDL goal <70 given hx of CAD s/p MI  Patient also taking bethanechol 25 mg 5 times daily, omeprazole 20 mg daily, and testosterone injection 200 mg every 14 days. He denies any problems tolerating these medications.    Past Medical History  Diagnosis Date  . Urinary retention   . Diabetes mellitus   . Myocardial infarct, old   . Prostate enlargement     awaiting biopsy results  . Coronary artery disease     IMI in 2007, RCA angioplasty  . Hypertension     not present 2014    Current Outpatient Prescriptions on File Prior to Visit  Medication Sig Dispense Refill  . bethanechol (URECHOLINE) 25 MG tablet Take 25 mg by mouth 5 (five) times daily.     Marland Kitchen glimepiride (AMARYL) 4 MG tablet Take 4 mg by mouth 2 (two) times daily. 1/2 tablet in the morning, 1 tablet in the evening    . metFORMIN (GLUMETZA) 500 MG (MOD)  24 hr tablet Take 1,000 mg by mouth 2 (two) times daily with a meal. One tablet in the morning, two tablets in the evening    . Omega-3 Fatty Acids (FISH OIL) 1000 MG CAPS Take 2 capsules by mouth daily.     Marland Kitchen omeprazole (PRILOSEC) 20 MG capsule Take 20 mg by mouth daily.    . pioglitazone (ACTOS) 30 MG tablet Take 30 mg by mouth daily.    . pravastatin (PRAVACHOL) 40 MG tablet Take 1 tablet by mouth every other day.  3  . testosterone cypionate (DEPOTESTOTERONE CYPIONATE) 200 MG/ML injection Inject 200 mg into the muscle every 14 (fourteen) days.   5   No current facility-administered medications on file prior to visit.    Allergies  Allergen Reactions  . Lisinopril Cough  . Codeine     Wife reports "changes his whole personality"  . Pravastatin Sodium     Myalgias  . Simvastatin Other (See Comments)    Myalgias    Assessment/Plan:  1. CAD - Patient was put on  aspirin 325 mg in 2007 after MI. Will decrease dose to 81 mg daily for continued CV benefit without additional bleeding risk.   2. Hyperlipidemia - patient currently on pravastatin 40 mg every other day and fish oil 2000 mg daily.  He states he stops taking the pravastatin when his muscles start to ache and resumes the medication when is resolves. Gave pt the option of trying low dose Crestor instead of pravastatin or adding on Zetia to his pravastatin if the occasional aches are tolerable on pravastatin - he would like to try adding Zetia. Rx sent for Zetia 10mg  daily, he will continue on fish oil 2000 mg daily.  3. Hypertension - currently taking lisinopril 2.5 mg with blood pressure consistently at goal of <150/90. He states he has had a dry cough since starting the medication. Will switch to losartan 25 mg daily. Advised pt to monitor for resolution of cough and to check his BP at home.    Megan E. Supple, PharmD, [place] [id number], [place],  [address] Phone: [phone]; Fax: [phone] 07/28/2015 11:50 AM

## 2015-07-28 NOTE — Patient Instructions (Signed)
Medication Instructions:  Your physician recommends that you continue on your current medications as directed. Please refer to the Current Medication list given to you today.   Labwork: None   Testing/Procedures: Your physician has recommended that you have a sleep study. This test records several body functions during sleep, including: brain activity, eye movement, oxygen and carbon dioxide blood levels, heart rate and rhythm, breathing rate and rhythm, the flow of air through your mouth and nose, snoring, body muscle movements, and chest and belly movement.    Follow-Up: Your physician wants you to follow-up in: 1 year with Dr Tamala Julian. (February 2018).  You will receive a reminder letter in the mail two months in advance. If you don't receive a letter, please call our office to schedule the follow-up appointment.       If you need a refill on your cardiac medications before your next appointment, please call your pharmacy.

## 2015-08-01 DIAGNOSIS — Z23 Encounter for immunization: Secondary | ICD-10-CM | POA: Diagnosis not present

## 2015-08-01 DIAGNOSIS — K219 Gastro-esophageal reflux disease without esophagitis: Secondary | ICD-10-CM | POA: Diagnosis not present

## 2015-08-01 DIAGNOSIS — E782 Mixed hyperlipidemia: Secondary | ICD-10-CM | POA: Diagnosis not present

## 2015-08-01 DIAGNOSIS — E1129 Type 2 diabetes mellitus with other diabetic kidney complication: Secondary | ICD-10-CM | POA: Diagnosis not present

## 2015-08-01 DIAGNOSIS — N183 Chronic kidney disease, stage 3 (moderate): Secondary | ICD-10-CM | POA: Diagnosis not present

## 2015-08-01 DIAGNOSIS — E669 Obesity, unspecified: Secondary | ICD-10-CM | POA: Diagnosis not present

## 2015-08-22 DIAGNOSIS — E291 Testicular hypofunction: Secondary | ICD-10-CM | POA: Diagnosis not present

## 2015-08-29 DIAGNOSIS — N401 Enlarged prostate with lower urinary tract symptoms: Secondary | ICD-10-CM | POA: Diagnosis not present

## 2015-08-29 DIAGNOSIS — E291 Testicular hypofunction: Secondary | ICD-10-CM | POA: Diagnosis not present

## 2015-08-29 DIAGNOSIS — N138 Other obstructive and reflux uropathy: Secondary | ICD-10-CM | POA: Diagnosis not present

## 2015-08-29 DIAGNOSIS — R972 Elevated prostate specific antigen [PSA]: Secondary | ICD-10-CM | POA: Diagnosis not present

## 2015-08-29 DIAGNOSIS — G629 Polyneuropathy, unspecified: Secondary | ICD-10-CM | POA: Diagnosis not present

## 2015-08-29 DIAGNOSIS — Z0000 Encounter for general adult medical examination without abnormal findings: Secondary | ICD-10-CM | POA: Diagnosis not present

## 2015-08-29 DIAGNOSIS — N312 Flaccid neuropathic bladder, not elsewhere classified: Secondary | ICD-10-CM | POA: Diagnosis not present

## 2015-10-05 DIAGNOSIS — L821 Other seborrheic keratosis: Secondary | ICD-10-CM | POA: Diagnosis not present

## 2015-10-05 DIAGNOSIS — D485 Neoplasm of uncertain behavior of skin: Secondary | ICD-10-CM | POA: Diagnosis not present

## 2015-10-05 DIAGNOSIS — D1801 Hemangioma of skin and subcutaneous tissue: Secondary | ICD-10-CM | POA: Diagnosis not present

## 2015-10-05 DIAGNOSIS — L570 Actinic keratosis: Secondary | ICD-10-CM | POA: Diagnosis not present

## 2015-10-05 DIAGNOSIS — Z85828 Personal history of other malignant neoplasm of skin: Secondary | ICD-10-CM | POA: Diagnosis not present

## 2015-10-05 DIAGNOSIS — L820 Inflamed seborrheic keratosis: Secondary | ICD-10-CM | POA: Diagnosis not present

## 2015-10-05 DIAGNOSIS — D0421 Carcinoma in situ of skin of right ear and external auricular canal: Secondary | ICD-10-CM | POA: Diagnosis not present

## 2015-10-08 ENCOUNTER — Ambulatory Visit (HOSPITAL_BASED_OUTPATIENT_CLINIC_OR_DEPARTMENT_OTHER): Payer: Medicare Other | Attending: Interventional Cardiology | Admitting: Cardiology

## 2015-10-08 VITALS — Ht 72.0 in | Wt 225.0 lb

## 2015-10-08 DIAGNOSIS — G4719 Other hypersomnia: Secondary | ICD-10-CM | POA: Diagnosis not present

## 2015-10-08 DIAGNOSIS — G4733 Obstructive sleep apnea (adult) (pediatric): Secondary | ICD-10-CM

## 2015-10-08 DIAGNOSIS — R5383 Other fatigue: Secondary | ICD-10-CM | POA: Insufficient documentation

## 2015-10-08 DIAGNOSIS — I10 Essential (primary) hypertension: Secondary | ICD-10-CM | POA: Insufficient documentation

## 2015-10-08 DIAGNOSIS — Z6832 Body mass index (BMI) 32.0-32.9, adult: Secondary | ICD-10-CM | POA: Insufficient documentation

## 2015-10-08 DIAGNOSIS — E669 Obesity, unspecified: Secondary | ICD-10-CM | POA: Insufficient documentation

## 2015-10-08 DIAGNOSIS — E785 Hyperlipidemia, unspecified: Secondary | ICD-10-CM | POA: Insufficient documentation

## 2015-10-08 DIAGNOSIS — I2510 Atherosclerotic heart disease of native coronary artery without angina pectoris: Secondary | ICD-10-CM | POA: Diagnosis not present

## 2015-10-08 DIAGNOSIS — E119 Type 2 diabetes mellitus without complications: Secondary | ICD-10-CM | POA: Diagnosis not present

## 2015-10-08 DIAGNOSIS — R0683 Snoring: Secondary | ICD-10-CM | POA: Diagnosis not present

## 2015-10-16 ENCOUNTER — Telehealth: Payer: Self-pay | Admitting: Cardiology

## 2015-10-16 ENCOUNTER — Encounter (HOSPITAL_BASED_OUTPATIENT_CLINIC_OR_DEPARTMENT_OTHER): Payer: Self-pay | Admitting: Cardiology

## 2015-10-16 NOTE — Telephone Encounter (Signed)
Please let patient know that they have significant sleep apnea and had successful CPAP titration and will be set up with CPAP unit.  Please let DME know that order is in EPIC.  Please set patient up for OV in 10 weeks 

## 2015-10-16 NOTE — Procedures (Signed)
Patient Name: Eric Carter, Eric Carter MRN: [id number] Study Date: 10/08/2015 Gender: Male D.O.B: [birth year] Age (years): 26 Referring Provider: Daneen Schick Interpreting Physician: Fransico Him MD, ABSM RPSGT: Baxter Flattery  BMI: 32 Weight (lbs): 225 Height (inches): 70 Neck Size: 18.00  CLINICAL INFORMATION Sleep Study Type: Split Night CPAP Indication for sleep study: Diabetes, Excessive Daytime Sleepiness, Fatigue, Obesity, Snoring, Witnessed Apneas Epworth Sleepiness Score: 10  SLEEP STUDY TECHNIQUE As per the AASM Manual for the Scoring of Sleep and Associated Events v2.3 (April 2016) with a hypopnea requiring 4% desaturations. The channels recorded and monitored were frontal, central and occipital EEG, electrooculogram (EOG), submentalis EMG (chin), nasal and oral airflow, thoracic and abdominal wall motion, anterior tibialis EMG, snore microphone, electrocardiogram, and pulse oximetry. Continuous positive airway pressure (CPAP) was initiated when the patient met split night criteria and was titrated according to treat sleep-disordered breathing.  MEDICATIONS Medications taken by the patient : Reviewed in the chart Medications administered by patient during sleep study : No sleep medicine administered.  RESPIRATORY PARAMETERS Diagnostic Total AHI (/hr): 19.8  RDI (/hr):19.8   OA Index (/hr): 10.4  CA Index (/hr): 0.3 REM AHI (/hr): 1.4  NREM AHI (/hr):25.5  Supine AHI (/hr):35.1  Non-supine AHI (/hr):0.73 Min O2 Sat (%):83.00 Mean O2 (%):92.54 Time below 88% (min):3.5    Titration Optimal Pressure (cm):12  AHI at Optimal Pressure (/hr):0.0  Min O2 at Optimal Pressure (%):91.0 Supine % at Optimal (%):0  Sleep % at Optimal (%):92    SLEEP ARCHITECTURE The recording time for the entire night was 391.8 minutes. During a baseline period of 210.9 minutes, the patient slept for 184.9 minutes in REM and nonREM, yielding a sleep efficiency of 87.7%. Sleep onset after lights out  was 8.9 minutes with a REM latency of 73.5 minutes. The patient spent 2.70% of the night in stage N1 sleep, 73.77% in stage N2 sleep, 0.00% in stage N3 and 23.52% in REM. During the titration period of 178.4 minutes, the patient slept for 155.0 minutes in REM and nonREM, yielding a sleep efficiency of 86.9%. Sleep onset after CPAP initiation was 1.6 minutes with a REM latency of 141.0 minutes. The patient spent 1.94% of the night in stage N1 sleep, 76.45% in stage N2 sleep, 0.00% in stage N3 and 21.61% in REM.  CARDIAC DATA The 2 lead EKG demonstrated sinus rhythm. The mean heart rate was 57.03 beats per minute. Other EKG findings include: None.  LEG MOVEMENT DATA The total Periodic Limb Movements of Sleep (PLMS) were 367. The PLMS index was 64.76 .  IMPRESSIONS - Moderate obstructive sleep apnea occurred during the diagnostic portion of the study(AHI = 19.8/hour). An optimal PAP pressure was selected for this patient ( 12 cm of water) - No significant central sleep apnea occurred during the diagnostic portion of the study (CAI = 0.3/hour). - Mild oxygen desaturation was noted during the diagnostic portion of the study (Min O2 = 83.00%). - No snoring was audible during the diagnostic portion of the study. - No cardiac abnormalities were noted during this study. - Severe periodic limb movements of sleep occurred during the study. DIAGNOSIS - Obstructive Sleep Apnea (327.23 [G47.33 ICD-10])  RECOMMENDATIONS - Trial of CPAP therapy on 12 cm H2O with a Medium size Fisher[employer]Paykel Full Face Mask Simplus mask and heated humidification. - Avoid alcohol, sedatives and other CNS depressants that may worsen sleep apnea and disrupt normal sleep architecture. - Sleep hygiene should be reviewed to assess factors that may improve sleep quality. -  Weight management and regular exercise should be initiated or continued. - Return to Sleep Center for re-evaluation after 10 weeks of  therapy      [place], American Board of Sleep Medicine  ELECTRONICALLY SIGNED ON:  10/16/2015, 9:28 PM [place] PH: [phone]   FX: [phone] [place]

## 2015-10-18 DIAGNOSIS — E119 Type 2 diabetes mellitus without complications: Secondary | ICD-10-CM | POA: Diagnosis not present

## 2015-10-18 DIAGNOSIS — H52223 Regular astigmatism, bilateral: Secondary | ICD-10-CM | POA: Diagnosis not present

## 2015-10-18 NOTE — Telephone Encounter (Signed)
Left message for patient to call back for sleep study results.  

## 2015-10-19 ENCOUNTER — Telehealth: Payer: Self-pay | Admitting: Cardiology

## 2015-10-19 NOTE — Telephone Encounter (Signed)
Pt would like a call back about his sleep study please.

## 2015-10-20 NOTE — Telephone Encounter (Signed)
Spoke to patient regarding sleep study. Let him know he does have sleep apnea and was able to be titrated the night of his study. Let him know we were going to send the orders to [facility] and they would contact him regarding the machine. We will contact him for a 10 week f/u once we have paperwork stating he has machine at home and is able to use it. Patient verbalized understanding and had no questions. Encouraged patient to call with any questions or concerns he may have.

## 2015-10-20 NOTE — Telephone Encounter (Signed)
Left message for patient to call back regarding sleep study. He can speak to [place] or [place]

## 2015-10-27 DIAGNOSIS — G4733 Obstructive sleep apnea (adult) (pediatric): Secondary | ICD-10-CM | POA: Diagnosis not present

## 2015-10-27 DIAGNOSIS — D0421 Carcinoma in situ of skin of right ear and external auricular canal: Secondary | ICD-10-CM | POA: Diagnosis not present

## 2015-10-31 DIAGNOSIS — G4733 Obstructive sleep apnea (adult) (pediatric): Secondary | ICD-10-CM | POA: Diagnosis not present

## 2015-11-23 DIAGNOSIS — E1129 Type 2 diabetes mellitus with other diabetic kidney complication: Secondary | ICD-10-CM | POA: Diagnosis not present

## 2015-11-23 DIAGNOSIS — E782 Mixed hyperlipidemia: Secondary | ICD-10-CM | POA: Diagnosis not present

## 2015-12-01 DIAGNOSIS — G4733 Obstructive sleep apnea (adult) (pediatric): Secondary | ICD-10-CM | POA: Diagnosis not present

## 2015-12-04 DIAGNOSIS — Z9181 History of falling: Secondary | ICD-10-CM | POA: Diagnosis not present

## 2015-12-04 DIAGNOSIS — N183 Chronic kidney disease, stage 3 (moderate): Secondary | ICD-10-CM | POA: Diagnosis not present

## 2015-12-04 DIAGNOSIS — Z789 Other specified health status: Secondary | ICD-10-CM | POA: Diagnosis not present

## 2015-12-04 DIAGNOSIS — E782 Mixed hyperlipidemia: Secondary | ICD-10-CM | POA: Diagnosis not present

## 2015-12-04 DIAGNOSIS — E1129 Type 2 diabetes mellitus with other diabetic kidney complication: Secondary | ICD-10-CM | POA: Diagnosis not present

## 2015-12-31 DIAGNOSIS — G4733 Obstructive sleep apnea (adult) (pediatric): Secondary | ICD-10-CM | POA: Diagnosis not present

## 2016-01-25 ENCOUNTER — Encounter (INDEPENDENT_AMBULATORY_CARE_PROVIDER_SITE_OTHER): Payer: Self-pay

## 2016-01-25 ENCOUNTER — Ambulatory Visit (INDEPENDENT_AMBULATORY_CARE_PROVIDER_SITE_OTHER): Payer: Medicare Other | Admitting: Cardiology

## 2016-01-25 ENCOUNTER — Ambulatory Visit: Payer: Medicare Other | Admitting: Physician Assistant

## 2016-01-25 ENCOUNTER — Encounter: Payer: Self-pay | Admitting: Cardiology

## 2016-01-25 VITALS — BP 142/64 | HR 69 | Ht 72.0 in | Wt 237.0 lb

## 2016-01-25 DIAGNOSIS — E669 Obesity, unspecified: Secondary | ICD-10-CM

## 2016-01-25 DIAGNOSIS — G4733 Obstructive sleep apnea (adult) (pediatric): Secondary | ICD-10-CM

## 2016-01-25 DIAGNOSIS — I10 Essential (primary) hypertension: Secondary | ICD-10-CM | POA: Diagnosis not present

## 2016-01-25 NOTE — Patient Instructions (Signed)

## 2016-01-25 NOTE — Progress Notes (Signed)
Cardiology Office Note    Date:  01/25/2016   ID:  SHAHZEB BAYAT, DOB 1931-06-24, MRN [id number]  PCP:  Leonides Sake, MD  Cardiologist:  Fransico Him, MD   Chief Complaint  Patient presents with  . Sleep Apnea  . Hypertension    History of Present Illness:  Eric Carter is a 82 y.o. male with a history of CAD, DM, HTN who was referred by Dr. Daneen Schick for evaluation of OSA. His epworth sleepiness score was 10 and he was complaining of excessive daytime sleepiness, snoring and witnessed apnea.  He underwent split night sleep study and was found to have moderate OSA with an AHI of 19.8/hr and underwent successful CPAP titration to 12cm H2O.  He is now here for followup. He is doing well with his CPAP therapy. He tolerates the full face mask and feels the pressure is adequate.  Since going on CPAP he feels more refreshed in the am and less sleepy during the day. He denies any significant mouth dryness or nasal congestion.  He does not think that he snores any more.      Past Medical History:  Diagnosis Date  . Coronary artery disease    IMI in 2007, RCA angioplasty  . Diabetes mellitus   . Hypertension    not present 2014  . Myocardial infarct, old   . Obesity (BMI 30.0-34.9) 01/25/2016  . OSA (obstructive sleep apnea) 01/25/2016   Moderate with AHI 19/hr now on CPAP  . Prostate enlargement    awaiting biopsy results  . Urinary retention     Past Surgical History:  Procedure Laterality Date  . CARDIAC CATHETERIZATION    . CATARACT EXTRACTION, BILATERAL    . PROSTATE BIOPSY    . SPLIT NIGHT STUDY  10/08/2015  . TONSILLECTOMY      Current Medications: Outpatient Medications Prior to Visit  Medication Sig Dispense Refill  . aspirin EC 81 MG tablet Take 1 tablet (81 mg total) by mouth daily. 90 tablet 3  . bethanechol (URECHOLINE) 25 MG tablet Take 25 mg by mouth 5 (five) times daily.     Marland Kitchen ezetimibe (ZETIA) 10 MG tablet Take 1 tablet (10 mg total) by mouth daily. 30  tablet 11  . glimepiride (AMARYL) 4 MG tablet Take 4 mg by mouth 2 (two) times daily. 1/2 tablet in the morning, 1 tablet in the evening    . losartan (COZAAR) 25 MG tablet Take 1 tablet (25 mg total) by mouth daily. 30 tablet 11  . metFORMIN (GLUMETZA) 500 MG (MOD) 24 hr tablet Take 1,000 mg by mouth 2 (two) times daily with a meal. One tablet in the morning, two tablets in the evening    . Omega-3 Fatty Acids (FISH OIL) 1000 MG CAPS Take 2 capsules by mouth daily.     Marland Kitchen omeprazole (PRILOSEC) 20 MG capsule Take 20 mg by mouth daily.    . pioglitazone (ACTOS) 30 MG tablet Take 30 mg by mouth daily.    . pravastatin (PRAVACHOL) 40 MG tablet Take 1 tablet by mouth every other day.  3  . testosterone cypionate (DEPOTESTOTERONE CYPIONATE) 200 MG/ML injection Inject 200 mg into the muscle every 14 (fourteen) days.   5   No facility-administered medications prior to visit.      Allergies:   Lisinopril; Codeine; Pravastatin sodium; and Simvastatin   Social History   Social History  . Marital status: Married    Spouse name: N/A  . Number of  children: N/A  . Years of education: N/A   Social History Main Topics  . Smoking status: Never Smoker  . Smokeless tobacco: Current User  . Alcohol use Yes     Comment: occassionally  . Drug use: No  . Sexual activity: Not Asked   Other Topics Concern  . None   Social History Narrative  . None     Family History:  The patient's family history includes Colon cancer in his father.   ROS:   Please see the history of present illness.    ROS All other systems reviewed and are negative.   PHYSICAL EXAM:   VS:  BP (!) 142/64   Pulse 69   Ht 6' (1.829 m)   Wt 237 lb (107.5 kg)   SpO2 96%   BMI 32.14 kg/m    GEN: Well nourished, well developed, in no acute distress  HEENT: normal  Neck: no JVD, carotid bruits, or masses Cardiac: RRR; no murmurs, rubs, or gallops,no edema.  Intact distal pulses bilaterally.  Respiratory:  clear to  auscultation bilaterally, normal work of breathing GI: soft, nontender, nondistended, + BS MS: no deformity or atrophy  Skin: warm and dry, no rash Neuro:  Alert and Oriented x 3, Strength and sensation are intact Psych: euthymic mood, full affect  Wt Readings from Last 3 Encounters:  01/25/16 237 lb (107.5 kg)  10/08/15 225 lb (102.1 kg)  07/28/15 235 lb (106.6 kg)      Studies/Labs Reviewed:   EKG:  EKG is not ordered today.    Recent Labs: No results found for requested labs within last 8760 hours.   Lipid Panel No results found for: CHOL, TRIG, HDL, CHOLHDL, VLDL, LDLCALC, LDLDIRECT  Additional studies/ records that were reviewed today include:  CPAP download and sleep study.     ASSESSMENT:    1. OSA (obstructive sleep apnea)   2. Essential hypertension, benign   3. Obesity (BMI 30.0-34.9)      PLAN:  In order of problems listed above:  OSA - the patient is tolerating PAP therapy well without any problems. The PAP download was reviewed today and showed an AHI of 4.6/hr on 12 cm H2O with 97% compliance in using more than 4 hours nightly.  The patient has been using and benefiting from CPAP use and will continue to benefit from therapy.  HTN - BP controlled on current meds.  Continue Cozaar. 3.   Obesity - I have encouraged him to continue bike riding 3 miles 3 times weekly  and cut back on carbs and portions.     Medication Adjustments/Labs and Tests Ordered: Current medicines are reviewed at length with the patient today.  Concerns regarding medicines are outlined above.  Medication changes, Labs and Tests ordered today are listed in the Patient Instructions below.  There are no Patient Instructions on file for this visit.   Signed, Fransico Him, MD  01/25/2016 1:28 PM    [facility] [place], [place], [place]  [address] Phone: [phone]; Fax: [phone]

## 2016-01-31 DIAGNOSIS — G4733 Obstructive sleep apnea (adult) (pediatric): Secondary | ICD-10-CM | POA: Diagnosis not present

## 2016-02-01 ENCOUNTER — Encounter: Payer: Self-pay | Admitting: Cardiology

## 2016-02-08 ENCOUNTER — Ambulatory Visit: Payer: Medicare Other | Admitting: Cardiology

## 2016-02-29 DIAGNOSIS — L570 Actinic keratosis: Secondary | ICD-10-CM | POA: Diagnosis not present

## 2016-02-29 DIAGNOSIS — L820 Inflamed seborrheic keratosis: Secondary | ICD-10-CM | POA: Diagnosis not present

## 2016-02-29 DIAGNOSIS — Z85828 Personal history of other malignant neoplasm of skin: Secondary | ICD-10-CM | POA: Diagnosis not present

## 2016-02-29 DIAGNOSIS — E291 Testicular hypofunction: Secondary | ICD-10-CM | POA: Diagnosis not present

## 2016-03-02 DIAGNOSIS — G4733 Obstructive sleep apnea (adult) (pediatric): Secondary | ICD-10-CM | POA: Diagnosis not present

## 2016-03-03 ENCOUNTER — Encounter: Payer: Self-pay | Admitting: Cardiology

## 2016-03-04 ENCOUNTER — Telehealth: Payer: Self-pay | Admitting: *Deleted

## 2016-03-04 NOTE — Telephone Encounter (Signed)
Erroneous Encounter

## 2016-03-06 DIAGNOSIS — E291 Testicular hypofunction: Secondary | ICD-10-CM | POA: Diagnosis not present

## 2016-03-06 DIAGNOSIS — N401 Enlarged prostate with lower urinary tract symptoms: Secondary | ICD-10-CM | POA: Diagnosis not present

## 2016-04-01 DIAGNOSIS — G4733 Obstructive sleep apnea (adult) (pediatric): Secondary | ICD-10-CM | POA: Diagnosis not present

## 2016-06-01 DIAGNOSIS — G4733 Obstructive sleep apnea (adult) (pediatric): Secondary | ICD-10-CM | POA: Diagnosis not present

## 2016-06-24 DIAGNOSIS — G4733 Obstructive sleep apnea (adult) (pediatric): Secondary | ICD-10-CM | POA: Diagnosis not present

## 2016-07-02 DIAGNOSIS — G4733 Obstructive sleep apnea (adult) (pediatric): Secondary | ICD-10-CM | POA: Diagnosis not present

## 2016-07-18 DIAGNOSIS — L820 Inflamed seborrheic keratosis: Secondary | ICD-10-CM | POA: Diagnosis not present

## 2016-07-24 DIAGNOSIS — E1129 Type 2 diabetes mellitus with other diabetic kidney complication: Secondary | ICD-10-CM | POA: Diagnosis not present

## 2016-07-24 DIAGNOSIS — E782 Mixed hyperlipidemia: Secondary | ICD-10-CM | POA: Diagnosis not present

## 2016-07-26 DIAGNOSIS — I2510 Atherosclerotic heart disease of native coronary artery without angina pectoris: Secondary | ICD-10-CM | POA: Diagnosis not present

## 2016-07-26 DIAGNOSIS — E872 Acidosis: Secondary | ICD-10-CM | POA: Diagnosis not present

## 2016-07-26 DIAGNOSIS — K219 Gastro-esophageal reflux disease without esophagitis: Secondary | ICD-10-CM | POA: Diagnosis not present

## 2016-07-26 DIAGNOSIS — D696 Thrombocytopenia, unspecified: Secondary | ICD-10-CM | POA: Diagnosis not present

## 2016-07-28 ENCOUNTER — Other Ambulatory Visit: Payer: Self-pay | Admitting: Interventional Cardiology

## 2016-08-02 DIAGNOSIS — G4733 Obstructive sleep apnea (adult) (pediatric): Secondary | ICD-10-CM | POA: Diagnosis not present

## 2016-08-08 NOTE — Progress Notes (Signed)
Cardiology Office Note    Date:  08/09/2016   ID:  Eric Carter, DOB May 09, 1932, MRN [id number]  PCP:  [name], FNP  Cardiologist: Sinclair Grooms, MD   Chief Complaint  Patient presents with  . Coronary Artery Disease    History of Present Illness:  Eric Carter is a 83 Carter.o. male presents for coronary artery disease, history of acute inferior infarction treated with angioplasty of the distal right coronary , hypertension, type 2 diabetes, and hyperlipidemia.  He denies angina. He has some mild dyspnea on exertion. This is unchanged over the past 6-12 months. He denies orthopnea. No lower extremity swelling.  His major issue has been compliance with medical therapy as prescribed. Listed are Zetia and pravastatin but he only takes simvastatin. He does not know what the dosages. We do not have recent lab data. This is followed by his primary care. Statins cause muscle aches/myalgias.   Past Medical History:  Diagnosis Date  . Coronary artery disease    IMI in 2007, RCA angioplasty  . Diabetes mellitus   . Hypertension    not present 2014  . Myocardial infarct, old   . Obesity (BMI 30.0-34.9) 01/25/2016  . OSA (obstructive sleep apnea) 01/25/2016   Moderate with AHI 19/hr now on CPAP  . Prostate enlargement    awaiting biopsy results  . Urinary retention     Past Surgical History:  Procedure Laterality Date  . CARDIAC CATHETERIZATION    . CATARACT EXTRACTION, BILATERAL    . PROSTATE BIOPSY    . SPLIT NIGHT STUDY  10/08/2015  . TONSILLECTOMY      Current Medications: Outpatient Medications Prior to Visit  Medication Sig Dispense Refill  . aspirin EC 81 MG tablet Take 1 tablet (81 mg total) by mouth daily. 90 tablet 3  . bethanechol (URECHOLINE) 25 MG tablet Take 25 mg by mouth 5 (five) times daily.     Marland Kitchen glimepiride (AMARYL) 4 MG tablet Take 4 mg by mouth 2 (two) times daily. 1/2 tablet in the morning, 1 tablet in the evening    . losartan (COZAAR) 25 MG  tablet TAKE 1 TABLET (25 MG TOTAL) BY MOUTH DAILY. 30 tablet 5  . metFORMIN (GLUMETZA) 500 MG (MOD) 24 hr tablet Take 1,000 mg by mouth 2 (two) times daily with a meal. One tablet in the morning, two tablets in the evening    . Omega-3 Fatty Acids (FISH OIL) 1000 MG CAPS Take 2 capsules by mouth daily.     Marland Kitchen omeprazole (PRILOSEC) 20 MG capsule Take 20 mg by mouth daily.    . pioglitazone (ACTOS) 30 MG tablet Take 30 mg by mouth daily.    Marland Kitchen testosterone cypionate (DEPOTESTOTERONE CYPIONATE) 200 MG/ML injection Inject 200 mg into the muscle every 14 (fourteen) days.   5  . ezetimibe (ZETIA) 10 MG tablet Take 1 tablet (10 mg total) by mouth daily. (Patient not taking: Reported on 08/09/2016) 30 tablet 11  . pravastatin (PRAVACHOL) 40 MG tablet Take 1 tablet by mouth every other day.  3   No facility-administered medications prior to visit.      Allergies:   Lisinopril; Codeine; Pravastatin sodium; and Simvastatin   Social History   Social History  . Marital status: Married    Spouse name: N/A  . Number of children: N/A  . Years of education: N/A   Social History Main Topics  . Smoking status: Never Smoker  . Smokeless tobacco: Current User  .  Alcohol use Yes     Comment: occassionally  . Drug use: No  . Sexual activity: Not Asked   Other Topics Concern  . None   Social History Narrative  . None     Family History:  The patient's family history includes Colon cancer in his father.   ROS:   Please see the history of present illness.    Muscle aches and pains. Dyspnea on exertion. Snoring. Otherwise no complaints.  All other systems reviewed and are negative.   PHYSICAL EXAM:   VS:  BP 124/66 (BP Location: Left Arm)   Pulse (!) 55   Ht 6' (1.829 m)   Wt 233 lb 12.8 oz (106.1 kg)   BMI 31.71 kg/m    GEN: Well nourished, well developed, in no acute distress  HEENT: normal  Neck: no JVD, carotid bruits, or masses Cardiac: RRR; no murmurs, rubs, or gallops,no edema    Respiratory:  clear to auscultation bilaterally, normal work of breathing GI: soft, nontender, nondistended, + BS MS: no deformity or atrophy  Skin: warm and dry, no rash Neuro:  Alert and Oriented x 3, Strength and sensation are intact Psych: euthymic mood, full affect  Wt Readings from Last 3 Encounters:  08/09/16 233 lb 12.8 oz (106.1 kg)  01/25/16 237 lb (107.5 kg)  10/08/15 225 lb (102.1 kg)      Studies/Labs Reviewed:   EKG:  EKG  First-degree AV block, right bundle branch block, old inferior infarct. Lateral infarct extension.  Recent Labs: No results found for requested labs within last 8760 hours.   Lipid Panel No results found for: CHOL, TRIG, HDL, CHOLHDL, VLDL, LDLCALC, LDLDIRECT  Additional studies/ records that were reviewed today include:  None    ASSESSMENT:    1. Atherosclerosis of native coronary artery of native heart without angina pectoris   2. Essential hypertension, benign   3. OSA (obstructive sleep apnea)   4. Other hyperlipidemia      PLAN:  In order of problems listed above:  1. Asymptomatic coronary artery disease. My impression is that he probably has occlusion of his distal right coronary. He is asymptomatic. We will continue risk factor modification aspirin, blood pressure control, and close clinical follow-up. 2. Continue losartan. 3. Continue C Pap 4. Lipid management is difficult because of musculoskeletal discomfort. Please see disc course above. Current plan is simvastatin Monday, Wednesday, and Friday. He will follow [place] with his current dose of simvastatin. This is followed by primary care. LDL target of 70 or less. He refuses lipid clinic evaluation.  Encouraged physical activity. Clinical follow-up in one year. Call if acceleration in dyspnea or exertion related chest discomfort.  Medication Adjustments/Labs and Tests Ordered: Current medicines are reviewed at length with the patient today.  Concerns regarding medicines are  outlined above.  Medication changes, Labs and Tests ordered today are listed in the Patient Instructions below. There are no Patient Instructions on file for this visit.   Signed, Sinclair Grooms, MD  08/09/2016 8:58 AM    [facility] [place], [place], [place]  [address] Phone: [phone]; Fax: [phone]

## 2016-08-09 ENCOUNTER — Encounter (INDEPENDENT_AMBULATORY_CARE_PROVIDER_SITE_OTHER): Payer: Self-pay

## 2016-08-09 ENCOUNTER — Encounter: Payer: Self-pay | Admitting: Interventional Cardiology

## 2016-08-09 ENCOUNTER — Telehealth: Payer: Self-pay | Admitting: Interventional Cardiology

## 2016-08-09 ENCOUNTER — Ambulatory Visit (INDEPENDENT_AMBULATORY_CARE_PROVIDER_SITE_OTHER): Payer: Medicare Other | Admitting: Interventional Cardiology

## 2016-08-09 VITALS — BP 124/66 | HR 55 | Ht 72.0 in | Wt 233.8 lb

## 2016-08-09 DIAGNOSIS — I2510 Atherosclerotic heart disease of native coronary artery without angina pectoris: Secondary | ICD-10-CM

## 2016-08-09 DIAGNOSIS — E784 Other hyperlipidemia: Secondary | ICD-10-CM | POA: Diagnosis not present

## 2016-08-09 DIAGNOSIS — G4733 Obstructive sleep apnea (adult) (pediatric): Secondary | ICD-10-CM | POA: Diagnosis not present

## 2016-08-09 DIAGNOSIS — E7849 Other hyperlipidemia: Secondary | ICD-10-CM

## 2016-08-09 DIAGNOSIS — I10 Essential (primary) hypertension: Secondary | ICD-10-CM | POA: Diagnosis not present

## 2016-08-09 NOTE — Telephone Encounter (Signed)
Med list updated

## 2016-08-09 NOTE — Telephone Encounter (Signed)
New message       Calling to let the nurse know pt takes simvastatin 5mg  daily

## 2016-08-09 NOTE — Patient Instructions (Signed)
Medication Instructions:  1) Call us and let us know what dose of Simvastatin you are taking so we can update your chart.  Labwork: None  Testing/Procedures: None  Follow-Up: Your physician wants you to follow-up in: 1 year with Dr. Tamala Julian. You will receive a reminder letter in the mail two months in advance. If you don't receive a letter, please call our office to schedule the follow-up appointment.   Any Other Special Instructions Will Be Listed Below (If Applicable).  Please contact our office if you are having chest pain or shortness of breath.   If you need a refill on your cardiac medications before your next appointment, please call your pharmacy.

## 2016-08-09 NOTE — Addendum Note (Signed)
Addended by: Loren Racer on: 08/09/2016 10:36 AM   Modules accepted: Orders

## 2016-08-26 ENCOUNTER — Emergency Department (HOSPITAL_COMMUNITY)
Admission: EM | Admit: 2016-08-26 | Discharge: 2016-08-26 | Disposition: A | Discharge status: Home / Self Care | Payer: Medicare Other | Attending: Emergency Medicine | Admitting: Emergency Medicine

## 2016-08-26 ENCOUNTER — Encounter (HOSPITAL_COMMUNITY): Payer: Self-pay | Admitting: Oncology

## 2016-08-26 DIAGNOSIS — Z7982 Long term (current) use of aspirin: Secondary | ICD-10-CM | POA: Diagnosis not present

## 2016-08-26 DIAGNOSIS — I10 Essential (primary) hypertension: Secondary | ICD-10-CM | POA: Insufficient documentation

## 2016-08-26 DIAGNOSIS — I252 Old myocardial infarction: Secondary | ICD-10-CM | POA: Insufficient documentation

## 2016-08-26 DIAGNOSIS — E119 Type 2 diabetes mellitus without complications: Secondary | ICD-10-CM | POA: Insufficient documentation

## 2016-08-26 DIAGNOSIS — R339 Retention of urine, unspecified: Secondary | ICD-10-CM | POA: Insufficient documentation

## 2016-08-26 DIAGNOSIS — R338 Other retention of urine: Secondary | ICD-10-CM

## 2016-08-26 DIAGNOSIS — I2510 Atherosclerotic heart disease of native coronary artery without angina pectoris: Secondary | ICD-10-CM | POA: Diagnosis not present

## 2016-08-26 LAB — CBC WITH DIFFERENTIAL/PLATELET
BASOS PCT: 0 %
Basophils Absolute: 0 10*3/uL (ref 0.0–0.1)
Eosinophils Absolute: 0.1 10*3/uL (ref 0.0–0.7)
Eosinophils Relative: 1 %
HCT: 44.5 % (ref 39.0–52.0)
HEMOGLOBIN: 15.1 g/dL (ref 13.0–17.0)
Lymphocytes Relative: 13 %
Lymphs Abs: 1 10*3/uL (ref 0.7–4.0)
MCH: 31.3 pg (ref 26.0–34.0)
MCHC: 33.9 g/dL (ref 30.0–36.0)
MCV: 92.3 fL (ref 78.0–100.0)
MONO ABS: 0.7 10*3/uL (ref 0.1–1.0)
MONOS PCT: 9 %
NEUTROS PCT: 77 %
Neutro Abs: 6 10*3/uL (ref 1.7–7.7)
PLATELETS: 114 10*3/uL — AB (ref 150–400)
RBC: 4.82 MIL/uL (ref 4.22–5.81)
RDW: 14.5 % (ref 11.5–15.5)
WBC: 7.8 10*3/uL (ref 4.0–10.5)

## 2016-08-26 LAB — BASIC METABOLIC PANEL
Anion gap: 6 (ref 5–15)
BUN: 18 mg/dL (ref 6–20)
CALCIUM: 9.3 mg/dL (ref 8.9–10.3)
CO2: 25 mmol/L (ref 22–32)
CREATININE: 1.11 mg/dL (ref 0.61–1.24)
Chloride: 107 mmol/L (ref 101–111)
GFR calc non Af Amer: 60 mL/min — ABNORMAL LOW (ref >60)
Glucose, Bld: 167 mg/dL — ABNORMAL HIGH (ref 65–99)
Potassium: 4 mmol/L (ref 3.5–5.1)
SODIUM: 138 mmol/L (ref 135–145)

## 2016-08-26 LAB — URINALYSIS, ROUTINE W REFLEX MICROSCOPIC
BILIRUBIN URINE: NEGATIVE
GLUCOSE, UA: NEGATIVE mg/dL
HGB URINE DIPSTICK: NEGATIVE
KETONES UR: NEGATIVE mg/dL
NITRITE: NEGATIVE
PH: 6 (ref 5.0–8.0)
Protein, ur: 30 mg/dL — AB
Specific Gravity, Urine: 1.018 (ref 1.005–1.030)
Squamous Epithelial / LPF: NONE SEEN

## 2016-08-26 NOTE — ED Provider Notes (Signed)
[name] Provider Note   CSN: [id number] Arrival date & time: 08/26/16  0102   By signing my name below, I, Eric Carter, attest that this documentation has been prepared under the direction and in the presence of Eric Porter, MD  Electronically Signed: Delton Carter, ED Scribe. 08/26/16. 4:00 AM.  Time seen 03:52 AM   History   Chief Complaint Chief Complaint  Patient presents with  . Urinary Retention   The history is provided by the patient. No language interpreter was used.   HPI Comments:  Eric Carter is a 83 y.o. male who presents to the Emergency Department complaining of acute onset, gradually worsening difficulty urinating onset March 9. He states he started noticing he was urinating more often and not as much urine was coming out. He states on the 10th and 11 he started urinating more frequently about every hour in then tonight it started being every 15 minutes. He denies seeing any blood and urine. He states he started having bloating in his abdomen feeling tight and painful about 6 hours ago tonight.  Pt states he experienced similar symptoms several years ago and had a foley catheter at that time.  No alleviating factors noted. Pt denies hematuria or any other associated symptoms. No other complaints noted.    PCP [name], FNP   Past Medical History:  Diagnosis Date  . Coronary artery disease    IMI in 2007, RCA angioplasty  . Diabetes mellitus   . Hypertension    not present 2014  . Myocardial infarct, old   . Obesity (BMI 30.0-34.9) 01/25/2016  . OSA (obstructive sleep apnea) 01/25/2016   Moderate with AHI 19/hr now on CPAP  . Prostate enlargement    awaiting biopsy results  . Urinary retention     Patient Active Problem List   Diagnosis Date Noted  . OSA (obstructive sleep apnea) 01/25/2016  . Obesity (BMI 30.0-34.9) 01/25/2016  . Diabetes type 2, controlled ([place]) 07/28/2015  . Medication management 07/28/2015  . Coronary  atherosclerosis of native coronary artery 07/09/2013  . Essential hypertension, benign 07/09/2013  . Hyperlipidemia 07/09/2013  . Reflux esophagitis 07/09/2013    Past Surgical History:  Procedure Laterality Date  . CARDIAC CATHETERIZATION    . CATARACT EXTRACTION, BILATERAL    . PROSTATE BIOPSY    . SPLIT NIGHT STUDY  10/08/2015  . TONSILLECTOMY         Home Medications    Prior to Admission medications   Medication Sig Start Date End Date Taking? Authorizing Provider  aspirin EC 81 MG tablet Take 1 tablet (81 mg total) by mouth daily. 07/28/15  Yes [name], MD  bethanechol (URECHOLINE) 25 MG tablet Take 50-75 mg by mouth 2 (two) times daily. 75 mg every morning and 50 mg every night   Yes Historical Provider, MD  glimepiride (AMARYL) 4 MG tablet Take 4 mg by mouth 2 (two) times daily.    Yes Historical Provider, MD  losartan (COZAAR) 25 MG tablet TAKE 1 TABLET (25 MG TOTAL) BY MOUTH DAILY. 07/29/16  Yes [name], MD  metFORMIN (GLUMETZA) 500 MG (MOD) 24 hr tablet Take 500-1,000 mg by mouth 2 (two) times daily with a meal. One tablet in the morning, two tablets in the evening   Yes Historical Provider, MD  Omega-3 Fatty Acids (FISH OIL) 1000 MG CAPS Take 1,000 mg by mouth 2 (two) times daily.    Yes Historical Provider, MD  omeprazole (PRILOSEC) 20 MG capsule  Take 20 mg by mouth daily.   Yes Historical Provider, MD  pioglitazone (ACTOS) 30 MG tablet Take 30 mg by mouth daily.   Yes Historical Provider, MD  simvastatin (ZOCOR) 5 MG tablet Take 5 mg by mouth daily at 6 PM.   Yes Historical Provider, MD  testosterone cypionate (DEPOTESTOTERONE CYPIONATE) 200 MG/ML injection Inject 200 mg into the muscle every 14 (fourteen) days.  07/06/14  Yes Historical Provider, MD  ezetimibe (ZETIA) 10 MG tablet Take 1 tablet (10 mg total) by mouth daily. Patient not taking: Reported on 08/09/2016 07/28/15   [name], MD    Family History Family History  Problem Relation Age of Onset    . Colon cancer Father   . Stomach cancer Neg Hx     Social History Social History  Substance Use Topics  . Smoking status: Never Smoker  . Smokeless tobacco: Current User  . Alcohol use Yes     Comment: occassionally  lives at home Lives with spouse   Allergies   Lisinopril; Codeine; Pravastatin sodium; and Simvastatin   Review of Systems Review of Systems  Gastrointestinal: Positive for abdominal distention and abdominal pain.  Genitourinary: Positive for difficulty urinating and frequency. Negative for hematuria.  All other systems reviewed and are negative.  Physical Exam Updated Vital Signs BP 152/77 (BP Location: Left Arm)   Pulse 70   Temp 97.5 F (36.4 C) (Oral)   Resp 20   SpO2 94%   Vital signs normal except for hypertension   Physical Exam  Constitutional: He is oriented to person, place, and time. He appears well-developed and well-nourished.  Non-toxic appearance. He does not appear ill. No distress.  HENT:  Head: Normocephalic and atraumatic.  Right Ear: External ear normal.  Left Ear: External ear normal.  Nose: Nose normal. No mucosal edema or rhinorrhea.  Mouth/Throat: Oropharynx is clear and moist and mucous membranes are normal. No dental abscesses or uvula swelling.  Eyes: Conjunctivae and EOM are normal. Pupils are equal, round, and reactive to light.  Neck: Normal range of motion and full passive range of motion without pain. Neck supple.  Cardiovascular: Normal rate, regular rhythm and normal heart sounds.  Exam reveals no gallop and no friction rub.   No murmur heard. Pulmonary/Chest: Effort normal and breath sounds normal. No respiratory distress. He has no wheezes. He has no rhonchi. He has no rales. He exhibits no tenderness and no crepitus.  Abdominal: Soft. Normal appearance and bowel sounds are normal. He exhibits distension. There is generalized tenderness. There is no rebound and no guarding.  Musculoskeletal: Normal range of motion.  He exhibits no edema or tenderness.  Moves all extremities well.   Neurological: He is alert and oriented to person, place, and time. He has normal strength. No cranial nerve deficit.  Skin: Skin is warm, dry and intact. No rash noted. No erythema. No pallor.  Psychiatric: He has a normal mood and affect. His speech is normal and behavior is normal. His mood appears not anxious.  Nursing note and vitals reviewed.   ED Treatments / Results  Labs (all labs ordered are listed, but only abnormal results are displayed) Results for orders placed or performed during the hospital encounter of 08/26/16  Urinalysis, Routine w reflex microscopic- may I&O cath if menses  Result Value Ref Range   Color, Urine YELLOW YELLOW   APPearance CLOUDY (A) CLEAR   Specific Gravity, Urine 1.018 1.005 - 1.030   pH 6.0 5.0 - 8.0  Glucose, UA NEGATIVE NEGATIVE mg/dL   Hgb urine dipstick NEGATIVE NEGATIVE   Bilirubin Urine NEGATIVE NEGATIVE   Ketones, ur NEGATIVE NEGATIVE mg/dL   Protein, ur 30 (A) NEGATIVE mg/dL   Nitrite NEGATIVE NEGATIVE   Leukocytes, UA TRACE (A) NEGATIVE   RBC / HPF 0-5 0 - 5 RBC/hpf   WBC, UA 6-30 0 - 5 WBC/hpf   Bacteria, UA RARE (A) NONE SEEN   Squamous Epithelial / LPF NONE SEEN NONE SEEN   Mucous PRESENT   Basic metabolic panel  Result Value Ref Range   Sodium 138 135 - 145 mmol/L   Potassium 4.0 3.5 - 5.1 mmol/L   Chloride 107 101 - 111 mmol/L   CO2 25 22 - 32 mmol/L   Glucose, Bld 167 (H) 65 - 99 mg/dL   BUN 18 6 - 20 mg/dL   Creatinine, Ser 1.11 0.61 - 1.24 mg/dL   Calcium 9.3 8.9 - 10.3 mg/dL   GFR calc non Af Amer 60 (L) >60 mL/min   GFR calc Af Amer >60 >60 mL/min   Anion gap 6 5 - 15  CBC with Differential  Result Value Ref Range   WBC 7.8 4.0 - 10.5 K/uL   RBC 4.82 4.22 - 5.81 MIL/uL   Hemoglobin 15.1 13.0 - 17.0 g/dL   HCT 44.5 39.0 - 52.0 %   MCV 92.3 78.0 - 100.0 fL   MCH 31.3 26.0 - 34.0 pg   MCHC 33.9 30.0 - 36.0 g/dL   RDW 14.5 11.5 - 15.5 %    Platelets 114 (L) 150 - 400 K/uL   Neutrophils Relative % 77 %   Neutro Abs 6.0 1.7 - 7.7 K/uL   Lymphocytes Relative 13 %   Lymphs Abs 1.0 0.7 - 4.0 K/uL   Monocytes Relative 9 %   Monocytes Absolute 0.7 0.1 - 1.0 K/uL   Eosinophils Relative 1 %   Eosinophils Absolute 0.1 0.0 - 0.7 K/uL   Basophils Relative 0 %   Basophils Absolute 0.0 0.0 - 0.1 K/uL   Laboratory interpretation all normal except hematuria, Doubtful UTI, urine culture sent    Procedures Procedures (including critical care time)  Medications Ordered in ED Medications - No data to display   Initial Impression / Assessment and Plan / ED Course  I have reviewed the triage vital signs and the nursing notes.  Pertinent labs & imaging results that were available during my care of the patient were reviewed by me and considered in my medical decision making (see chart for details).     DIAGNOSTIC STUDIES:  Oxygen Saturation is 94% on RA, adequate by my interpretation.    COORDINATION OF CARE:  3:55 AM Discussed treatment plan with pt at bedside and pt agreed to plan.  Bladder scan showed 975 mL of urine. Foley catheter was inserted. Due to him having symptoms over the past several days lab work was done to make sure his kidney function was normal.  Recheck at discharge, discussed his test results and need to f/u with urology.    Final Clinical Impressions(s) / ED Diagnoses   Final diagnoses:  Acute urinary retention     Plan discharge  Eric Porter, MD, FACEP  I personally performed the services described in this documentation, which was scribed in my presence. The recorded information has been reviewed and considered.  Eric Porter, MD, Barbette Or, MD 08/26/16 [phone]

## 2016-08-26 NOTE — ED Triage Notes (Signed)
Pt states he has been unable to void x 6 hours.  States that at that time he only voided, "A dribble."  Pt reports issues w/ retention since Friday.  Has had to have a foley in the past d/t urinary retention.

## 2016-08-26 NOTE — Discharge Instructions (Signed)
Look at the foley catheter care instructions. Call [facility] to get an appointment to be rechecked most likely the end of this week or the beginning of next week. Return to the ED if the catheter stops draining or you get a fever, abdominal pain or vomiting.

## 2016-08-26 NOTE — ED Notes (Signed)
Switched pt to leg bag prior to discharge

## 2016-08-28 DIAGNOSIS — R338 Other retention of urine: Secondary | ICD-10-CM | POA: Diagnosis not present

## 2016-08-28 LAB — URINE CULTURE: SPECIAL REQUESTS: NORMAL

## 2016-08-29 ENCOUNTER — Telehealth: Payer: Self-pay | Admitting: *Deleted

## 2016-08-29 NOTE — Telephone Encounter (Signed)
Post ED Visit - Positive Culture Follow-up: Unsuccessful Patient Follow-up  Culture assessed and recommendations reviewed by: []  Elenor Quinones, Pharm.D. []  Heide Guile, Pharm.D., BCPS []  Parks Neptune, Pharm.D. []  Alycia Rossetti, Pharm.D., BCPS []  [place], [place].D., BCPS, AAHIVP [x]  Legrand Como, Pharm.D., BCPS, AAHIVP []  Milus Glazier, Pharm.D. []  Stephens November, Pharm.D.  Positive urine culture  [x]  Patient discharged without antimicrobial prescription and treatment is now indicated []  Organism is resistant to prescribed ED discharge antimicrobial []  Patient with positive blood cultures   Unable to contact patient after 3 attempts, letter will be sent to address on file  Ardeen Fillers 08/29/2016, 1:31 PM

## 2016-08-29 NOTE — Progress Notes (Signed)
ED Antimicrobial Stewardship Positive Culture Follow Up   Eric Carter is an 83 y.o. male who presented to [facility] on 08/26/2016 with a chief complaint of  Chief Complaint  Patient presents with  . Urinary Retention    Recent Results (from the past 720 hour(s))  Urine culture     Status: Abnormal   Collection Time: 08/26/16  4:00 AM  Result Value Ref Range Status   Specimen Description URINE, CATHETERIZED  Final   Special Requests Normal  Final   Culture (A)  Final    >=100,000 COLONIES/mL STAPHYLOCOCCUS SPECIES (COAGULASE NEGATIVE)   Report Status 08/28/2016 FINAL  Final   Organism ID, Bacteria STAPHYLOCOCCUS SPECIES (COAGULASE NEGATIVE) (A)  Final      Susceptibility   Staphylococcus species (coagulase negative) - MIC*    CIPROFLOXACIN <=0.5 SENSITIVE Sensitive     GENTAMICIN <=0.5 SENSITIVE Sensitive     NITROFURANTOIN <=16 SENSITIVE Sensitive     OXACILLIN <=0.25 SENSITIVE Sensitive     TETRACYCLINE <=1 SENSITIVE Sensitive     VANCOMYCIN 1 SENSITIVE Sensitive     TRIMETH/SULFA <=10 SENSITIVE Sensitive     CLINDAMYCIN <=0.25 SENSITIVE Sensitive     RIFAMPIN <=0.5 SENSITIVE Sensitive     Inducible Clindamycin NEGATIVE Sensitive     * >=100,000 COLONIES/mL STAPHYLOCOCCUS SPECIES (COAGULASE NEGATIVE)    [x]  Patient discharged originally without antimicrobial agent and treatment is now indicated  New antibiotic prescription: Cephalexin 500mg  PO TID x 7 days  ED Provider: Amie Portland, PA-C   Norva Riffle 08/29/2016, 12:16 PM Infectious Diseases Pharmacist Phone# [phone]

## 2016-09-04 ENCOUNTER — Encounter (HOSPITAL_COMMUNITY): Payer: Self-pay | Admitting: Emergency Medicine

## 2016-09-04 ENCOUNTER — Emergency Department (HOSPITAL_COMMUNITY)
Admission: EM | Admit: 2016-09-04 | Discharge: 2016-09-05 | Disposition: A | Discharge status: Home / Self Care | Payer: Medicare Other | Attending: Emergency Medicine | Admitting: Emergency Medicine

## 2016-09-04 DIAGNOSIS — Z7984 Long term (current) use of oral hypoglycemic drugs: Secondary | ICD-10-CM | POA: Insufficient documentation

## 2016-09-04 DIAGNOSIS — E119 Type 2 diabetes mellitus without complications: Secondary | ICD-10-CM | POA: Diagnosis not present

## 2016-09-04 DIAGNOSIS — I10 Essential (primary) hypertension: Secondary | ICD-10-CM | POA: Diagnosis not present

## 2016-09-04 DIAGNOSIS — Z7982 Long term (current) use of aspirin: Secondary | ICD-10-CM | POA: Diagnosis not present

## 2016-09-04 DIAGNOSIS — I2510 Atherosclerotic heart disease of native coronary artery without angina pectoris: Secondary | ICD-10-CM | POA: Insufficient documentation

## 2016-09-04 DIAGNOSIS — I252 Old myocardial infarction: Secondary | ICD-10-CM | POA: Diagnosis not present

## 2016-09-04 DIAGNOSIS — R338 Other retention of urine: Secondary | ICD-10-CM | POA: Diagnosis not present

## 2016-09-04 DIAGNOSIS — R339 Retention of urine, unspecified: Secondary | ICD-10-CM | POA: Diagnosis not present

## 2016-09-04 LAB — URINALYSIS, ROUTINE W REFLEX MICROSCOPIC
BILIRUBIN URINE: NEGATIVE
GLUCOSE, UA: 50 mg/dL — AB
Ketones, ur: NEGATIVE mg/dL
Leukocytes, UA: NEGATIVE
NITRITE: NEGATIVE
Protein, ur: NEGATIVE mg/dL
SPECIFIC GRAVITY, URINE: 1.006 (ref 1.005–1.030)
Squamous Epithelial / LPF: NONE SEEN
pH: 5 (ref 5.0–8.0)

## 2016-09-04 LAB — I-STAT CHEM 8, ED
BUN: 24 mg/dL — AB (ref 6–20)
CALCIUM ION: 1.13 mmol/L — AB (ref 1.15–1.40)
CHLORIDE: 105 mmol/L (ref 101–111)
Creatinine, Ser: 1 mg/dL (ref 0.61–1.24)
GLUCOSE: 195 mg/dL — AB (ref 65–99)
HCT: 47 % (ref 39.0–52.0)
Hemoglobin: 16 g/dL (ref 13.0–17.0)
Potassium: 4.6 mmol/L (ref 3.5–5.1)
SODIUM: 139 mmol/L (ref 135–145)
TCO2: 21 mmol/L (ref 0–100)

## 2016-09-04 MED ORDER — LIDOCAINE HCL 2 % EX GEL
1.0000 "application " | Freq: Once | CUTANEOUS | Status: AC | PRN
  Administered 2016-09-04: 1 via URETHRAL
  Filled 2016-09-04: qty 11

## 2016-09-04 NOTE — ED Triage Notes (Signed)
Pt had foley catheter removed this morning and has since had a hard time getting anymore than few cc's out.  Pt reports 9/10 pain and feels like he is having muscle spasms.

## 2016-09-04 NOTE — ED Provider Notes (Signed)
[facility] DEPT Provider Note   CSN: [id number] Arrival date & time: 09/04/16  1936     History   Chief Complaint Chief Complaint  Patient presents with  . Urinary Retention    HPI Eric Carter is a 83 y.o. male.  The history is provided by the patient.   CC: trouble urinating  Onset/Duration: 1.5 week ago Timing: constant Severity: severe Modifying Factors:  Improved by: foley placement during last visit  Worsened by: foley was removed today by Urology Associated Signs/Symptoms:  Pertinent (+): suprapubic fullness  Pertinent (-): fever, nausea, vomiting, chest pain, sob,   Past Medical History:  Diagnosis Date  . Coronary artery disease    IMI in 2007, RCA angioplasty  . Diabetes mellitus   . Hypertension    not present 2014  . Myocardial infarct, old   . Obesity (BMI 30.0-34.9) 01/25/2016  . OSA (obstructive sleep apnea) 01/25/2016   Moderate with AHI 19/hr now on CPAP  . Prostate enlargement    awaiting biopsy results  . Urinary retention     Patient Active Problem List   Diagnosis Date Noted  . OSA (obstructive sleep apnea) 01/25/2016  . Obesity (BMI 30.0-34.9) 01/25/2016  . Diabetes type 2, controlled ([place]) 07/28/2015  . Medication management 07/28/2015  . Coronary atherosclerosis of native coronary artery 07/09/2013  . Essential hypertension, benign 07/09/2013  . Hyperlipidemia 07/09/2013  . Reflux esophagitis 07/09/2013    Past Surgical History:  Procedure Laterality Date  . CARDIAC CATHETERIZATION    . CATARACT EXTRACTION, BILATERAL    . PROSTATE BIOPSY    . SPLIT NIGHT STUDY  10/08/2015  . TONSILLECTOMY         Home Medications    Prior to Admission medications   Medication Sig Start Date End Date Taking? Authorizing Provider  aspirin EC 81 MG tablet Take 1 tablet (81 mg total) by mouth daily. 07/28/15  Yes [name], MD  bethanechol (URECHOLINE) 25 MG tablet Take 50-75 mg by mouth 2 (two) times daily. 75 mg every morning  and 50 mg every night   Yes Historical Provider, MD  co-enzyme Q-10 30 MG capsule Take 30 mg by mouth daily.   Yes Historical Provider, MD  glimepiride (AMARYL) 4 MG tablet Take 4 mg by mouth 2 (two) times daily.    Yes Historical Provider, MD  losartan (COZAAR) 25 MG tablet TAKE 1 TABLET (25 MG TOTAL) BY MOUTH DAILY. 07/29/16  Yes [name], MD  metFORMIN (GLUMETZA) 500 MG (MOD) 24 hr tablet Take 500-1,000 mg by mouth 2 (two) times daily with a meal. One tablet in the morning, two tablets in the evening   Yes Historical Provider, MD  Omega-3 Fatty Acids (FISH OIL) 1000 MG CAPS Take 1,000 mg by mouth 2 (two) times daily.    Yes Historical Provider, MD  omeprazole (PRILOSEC) 20 MG capsule Take 20 mg by mouth daily.   Yes Historical Provider, MD  pioglitazone (ACTOS) 30 MG tablet Take 30 mg by mouth daily.   Yes Historical Provider, MD  simvastatin (ZOCOR) 5 MG tablet Take 5 mg by mouth daily at 6 PM.   Yes Historical Provider, MD  tamsulosin (FLOMAX) 0.4 MG CAPS capsule Take 0.4 mg by mouth daily after supper.  08/28/16  Yes Historical Provider, MD  testosterone cypionate (DEPOTESTOTERONE CYPIONATE) 200 MG/ML injection Inject 200 mg into the muscle every 14 (fourteen) days.  07/06/14  Yes Historical Provider, MD  ezetimibe (ZETIA) 10 MG tablet Take 1 tablet (  10 mg total) by mouth daily. Patient not taking: Reported on 08/09/2016 07/28/15   [name], MD    Family History Family History  Problem Relation Age of Onset  . Colon cancer Father   . Stomach cancer Neg Hx     Social History Social History  Substance Use Topics  . Smoking status: Never Smoker  . Smokeless tobacco: Current User  . Alcohol use Yes     Comment: occassionally     Allergies   Lisinopril; Codeine; Pravastatin sodium; and Simvastatin   Review of Systems Review of Systems Ten systems are reviewed and are negative for acute change except as noted in the HPI   Physical Exam Updated Vital Signs   Physical  Exam  Constitutional: He is oriented to person, place, and time. He appears well-developed and well-nourished. No distress.  HENT:  Head: Normocephalic and atraumatic.  Nose: Nose normal.  Eyes: Conjunctivae and EOM are normal. Pupils are equal, round, and reactive to light. Right eye exhibits no discharge. Left eye exhibits no discharge. No scleral icterus.  Neck: Normal range of motion. Neck supple.  Cardiovascular: Normal rate and regular rhythm.  Exam reveals no gallop and no friction rub.   No murmur heard. Pulmonary/Chest: Effort normal and breath sounds normal. No stridor. No respiratory distress. He has no rales.  Abdominal: Soft. He exhibits distension. There is tenderness in the suprapubic area. There is no rigidity, no rebound and no guarding.  Genitourinary: Prostate is enlarged. Prostate is not tender.  Musculoskeletal: He exhibits no edema or tenderness.  Neurological: He is alert and oriented to person, place, and time.  Skin: Skin is warm and dry. No rash noted. He is not diaphoretic. No erythema.  Psychiatric: He has a normal mood and affect.  Vitals reviewed.    ED Treatments / Results  Labs (all labs ordered are listed, but only abnormal results are displayed) Labs Reviewed  URINALYSIS, ROUTINE W REFLEX MICROSCOPIC - Abnormal; Notable for the following:       Result Value   Color, Urine STRAW (*)    Glucose, UA 50 (*)    Hgb urine dipstick SMALL (*)    Bacteria, UA MANY (*)    All other components within normal limits  I-STAT CHEM 8, ED - Abnormal; Notable for the following:    BUN 24 (*)    Glucose, Bld 195 (*)    Calcium, Ion 1.13 (*)    All other components within normal limits    EKG  EKG Interpretation None       Radiology No results found.  Procedures Procedures (including critical care time)  Medications Ordered in ED Medications  lidocaine (XYLOCAINE) 2 % jelly 1 application (1 application Urethral Given 09/04/16 2127)     Initial  Impression / Assessment and Plan / ED Course  I have reviewed the triage vital signs and the nursing notes.  Pertinent labs & imaging results that were available during my care of the patient were reviewed by me and considered in my medical decision making (see chart for details).     Urinary retention. Failed voiding challenge. 919cc post void residual. Foley inserted. UA without evidence of urinary tract infection. Bacteriuria likely secondary from Foley. Renal function intact.  The patient is safe for discharge with strict return precautions.     Final Clinical Impressions(s) / ED Diagnoses   Final diagnoses:  Urinary retention   Disposition: Discharge  Condition: Good  I have discussed the results, Dx  and Tx plan with the patient and family who expressed understanding and agree(s) with the plan. Discharge instructions discussed at great length. The patient and family was given strict return precautions who verbalized understanding of the instructions. No further questions at time of discharge.    Discharge Medication List as of 09/05/2016 12:13 AM      Follow Up: [place] [phone] Schedule an appointment as soon as possible for a visit in 1 week       Fatima Blank, MD 09/05/16 [phone]

## 2016-09-05 ENCOUNTER — Telehealth: Payer: Self-pay | Admitting: *Deleted

## 2016-09-05 NOTE — Telephone Encounter (Signed)
Post ED Visit - Positive Culture Follow-up: Successful Patient Follow-Up  Culture assessed and recommendations reviewed by: []  Elenor Quinones, Pharm.D. []  Heide Guile, Pharm.D., BCPS AQ-ID []  Parks Neptune, Pharm.D., BCPS []  Alycia Rossetti, Pharm.D., BCPS []  [place], Pharm.D., BCPS, AAHIVP [x]  Legrand Como, Pharm.D., BCPS, AAHIVP []  Salome Arnt, PharmD, BCPS []  Dimitri Ped, PharmD, BCPS []  Vincenza Hews, PharmD, BCPS  Positive urine culture  [x]  Patient discharged without antimicrobial prescription and treatment is now indicated []  Organism is resistant to prescribed ED discharge antimicrobial []  Patient with positive blood cultures  Changes discussed with ED provider, Pearlie Oyster, PA New antibiotic prescription Cephalexin 500mg  PO TID x 7 days Called to [facility], [place], [phone]  Contacted by patient, date 09/05/2016, time [place], [place] 09/05/2016, 2:13 PM

## 2016-09-05 NOTE — ED Notes (Signed)
Leg bag applied and education provided

## 2016-09-10 DIAGNOSIS — R338 Other retention of urine: Secondary | ICD-10-CM | POA: Diagnosis not present

## 2016-09-16 DIAGNOSIS — R338 Other retention of urine: Secondary | ICD-10-CM | POA: Diagnosis not present

## 2016-09-16 DIAGNOSIS — E291 Testicular hypofunction: Secondary | ICD-10-CM | POA: Diagnosis not present

## 2016-09-16 DIAGNOSIS — N401 Enlarged prostate with lower urinary tract symptoms: Secondary | ICD-10-CM | POA: Diagnosis not present

## 2016-09-24 DIAGNOSIS — R338 Other retention of urine: Secondary | ICD-10-CM | POA: Diagnosis not present

## 2016-09-25 ENCOUNTER — Other Ambulatory Visit: Payer: Self-pay | Admitting: Urology

## 2016-09-25 DIAGNOSIS — M62838 Other muscle spasm: Secondary | ICD-10-CM | POA: Diagnosis not present

## 2016-09-25 DIAGNOSIS — R338 Other retention of urine: Secondary | ICD-10-CM | POA: Diagnosis not present

## 2016-09-25 DIAGNOSIS — M6281 Muscle weakness (generalized): Secondary | ICD-10-CM | POA: Diagnosis not present

## 2016-09-25 DIAGNOSIS — K5900 Constipation, unspecified: Secondary | ICD-10-CM | POA: Diagnosis not present

## 2016-10-01 ENCOUNTER — Encounter (HOSPITAL_BASED_OUTPATIENT_CLINIC_OR_DEPARTMENT_OTHER): Payer: Self-pay | Admitting: *Deleted

## 2016-10-01 NOTE — Progress Notes (Signed)
NPO AFTER MN.  ARRIVE AT 0900.  NEEDS ISTAT.  CURRENT EKG IN CHART AND EPIC.  WILL TAKE COZAAR, PRILOSEC, AND URECHOLINE AM DOS W/ SIPS OF WATER. WILL BRING CPAP.

## 2016-10-02 DIAGNOSIS — K5900 Constipation, unspecified: Secondary | ICD-10-CM | POA: Diagnosis not present

## 2016-10-02 DIAGNOSIS — M6281 Muscle weakness (generalized): Secondary | ICD-10-CM | POA: Diagnosis not present

## 2016-10-02 DIAGNOSIS — R338 Other retention of urine: Secondary | ICD-10-CM | POA: Diagnosis not present

## 2016-10-02 DIAGNOSIS — M62838 Other muscle spasm: Secondary | ICD-10-CM | POA: Diagnosis not present

## 2016-10-07 ENCOUNTER — Encounter (HOSPITAL_BASED_OUTPATIENT_CLINIC_OR_DEPARTMENT_OTHER)
Admission: RE | Disposition: A | Discharge status: Home / Self Care | Payer: Self-pay | Source: Ambulatory Visit | Attending: Urology

## 2016-10-07 ENCOUNTER — Ambulatory Visit (HOSPITAL_BASED_OUTPATIENT_CLINIC_OR_DEPARTMENT_OTHER)
Admission: RE | Admit: 2016-10-07 | Discharge: 2016-10-07 | Disposition: A | Discharge status: Home / Self Care | Payer: Medicare Other | Source: Ambulatory Visit | Attending: Urology | Admitting: Urology

## 2016-10-07 ENCOUNTER — Encounter (HOSPITAL_BASED_OUTPATIENT_CLINIC_OR_DEPARTMENT_OTHER): Payer: Self-pay | Admitting: *Deleted

## 2016-10-07 ENCOUNTER — Ambulatory Visit (HOSPITAL_BASED_OUTPATIENT_CLINIC_OR_DEPARTMENT_OTHER): Payer: Medicare Other | Admitting: Anesthesiology

## 2016-10-07 DIAGNOSIS — N401 Enlarged prostate with lower urinary tract symptoms: Secondary | ICD-10-CM | POA: Insufficient documentation

## 2016-10-07 DIAGNOSIS — N3289 Other specified disorders of bladder: Secondary | ICD-10-CM | POA: Insufficient documentation

## 2016-10-07 DIAGNOSIS — Z79899 Other long term (current) drug therapy: Secondary | ICD-10-CM | POA: Diagnosis not present

## 2016-10-07 DIAGNOSIS — K210 Gastro-esophageal reflux disease with esophagitis: Secondary | ICD-10-CM | POA: Diagnosis not present

## 2016-10-07 DIAGNOSIS — I10 Essential (primary) hypertension: Secondary | ICD-10-CM | POA: Diagnosis not present

## 2016-10-07 DIAGNOSIS — Z7984 Long term (current) use of oral hypoglycemic drugs: Secondary | ICD-10-CM | POA: Diagnosis not present

## 2016-10-07 DIAGNOSIS — N323 Diverticulum of bladder: Secondary | ICD-10-CM | POA: Insufficient documentation

## 2016-10-07 DIAGNOSIS — Z7982 Long term (current) use of aspirin: Secondary | ICD-10-CM | POA: Insufficient documentation

## 2016-10-07 DIAGNOSIS — R338 Other retention of urine: Secondary | ICD-10-CM | POA: Insufficient documentation

## 2016-10-07 DIAGNOSIS — E1142 Type 2 diabetes mellitus with diabetic polyneuropathy: Secondary | ICD-10-CM | POA: Diagnosis not present

## 2016-10-07 DIAGNOSIS — I252 Old myocardial infarction: Secondary | ICD-10-CM | POA: Diagnosis not present

## 2016-10-07 DIAGNOSIS — E7800 Pure hypercholesterolemia, unspecified: Secondary | ICD-10-CM | POA: Diagnosis not present

## 2016-10-07 DIAGNOSIS — N139 Obstructive and reflux uropathy, unspecified: Secondary | ICD-10-CM

## 2016-10-07 DIAGNOSIS — I2510 Atherosclerotic heart disease of native coronary artery without angina pectoris: Secondary | ICD-10-CM | POA: Diagnosis not present

## 2016-10-07 DIAGNOSIS — N400 Enlarged prostate without lower urinary tract symptoms: Secondary | ICD-10-CM | POA: Diagnosis not present

## 2016-10-07 DIAGNOSIS — K219 Gastro-esophageal reflux disease without esophagitis: Secondary | ICD-10-CM | POA: Diagnosis not present

## 2016-10-07 DIAGNOSIS — Z791 Long term (current) use of non-steroidal anti-inflammatories (NSAID): Secondary | ICD-10-CM | POA: Diagnosis not present

## 2016-10-07 HISTORY — DX: Atrioventricular block, first degree: I44.0

## 2016-10-07 HISTORY — DX: Obstructive sleep apnea (adult) (pediatric): G47.33

## 2016-10-07 HISTORY — DX: Unspecified right bundle-branch block: I45.10

## 2016-10-07 HISTORY — DX: Other constipation: K59.09

## 2016-10-07 HISTORY — DX: Gastro-esophageal reflux disease without esophagitis: K21.9

## 2016-10-07 HISTORY — DX: Dependence on other enabling machines and devices: Z99.89

## 2016-10-07 HISTORY — DX: Personal history of other diseases of the circulatory system: Z86.79

## 2016-10-07 HISTORY — DX: Hyperlipidemia, unspecified: E78.5

## 2016-10-07 HISTORY — DX: Presence of spectacles and contact lenses: Z97.3

## 2016-10-07 HISTORY — DX: Benign prostatic hyperplasia without lower urinary tract symptoms: N40.0

## 2016-10-07 HISTORY — DX: Personal history of other specified conditions: Z87.898

## 2016-10-07 HISTORY — PX: THULIUM LASER TURP (TRANSURETHRAL RESECTION OF PROSTATE): SHX6744

## 2016-10-07 LAB — GLUCOSE, CAPILLARY: GLUCOSE-CAPILLARY: 139 mg/dL — AB (ref 65–99)

## 2016-10-07 LAB — POCT I-STAT 4, (NA,K, GLUC, HGB,HCT)
Glucose, Bld: 155 mg/dL — ABNORMAL HIGH (ref 65–99)
HEMATOCRIT: 41 % (ref 39.0–52.0)
HEMOGLOBIN: 13.9 g/dL (ref 13.0–17.0)
Potassium: 4.1 mmol/L (ref 3.5–5.1)
SODIUM: 141 mmol/L (ref 135–145)

## 2016-10-07 SURGERY — THULIUM LASER TURP (TRANSURETHRAL RESECTION OF PROSTATE)
Anesthesia: General

## 2016-10-07 MED ORDER — FENTANYL CITRATE (PF) 100 MCG/2ML IJ SOLN
INTRAMUSCULAR | Status: AC
  Filled 2016-10-07: qty 2

## 2016-10-07 MED ORDER — BELLADONNA ALKALOIDS-OPIUM 16.2-60 MG RE SUPP
RECTAL | Status: DC | PRN
  Administered 2016-10-07: 1 via RECTAL

## 2016-10-07 MED ORDER — ACETAMINOPHEN 500 MG PO TABS
ORAL_TABLET | ORAL | Status: AC
  Filled 2016-10-07: qty 2

## 2016-10-07 MED ORDER — PROMETHAZINE HCL 25 MG/ML IJ SOLN
6.2500 mg | INTRAMUSCULAR | Status: DC | PRN
  Filled 2016-10-07: qty 1

## 2016-10-07 MED ORDER — LIDOCAINE 2% (20 MG/ML) 5 ML SYRINGE
INTRAMUSCULAR | Status: AC
  Filled 2016-10-07: qty 5

## 2016-10-07 MED ORDER — LACTATED RINGERS IV SOLN
INTRAVENOUS | Status: DC
  Administered 2016-10-07: 10:00:00 via INTRAVENOUS
  Filled 2016-10-07: qty 1000

## 2016-10-07 MED ORDER — LIDOCAINE 2% (20 MG/ML) 5 ML SYRINGE
INTRAMUSCULAR | Status: DC | PRN
  Administered 2016-10-07: 50 mg via INTRAVENOUS

## 2016-10-07 MED ORDER — KETOROLAC TROMETHAMINE 30 MG/ML IJ SOLN
INTRAMUSCULAR | Status: AC
  Filled 2016-10-07: qty 1

## 2016-10-07 MED ORDER — ONDANSETRON HCL 4 MG/2ML IJ SOLN
INTRAMUSCULAR | Status: DC | PRN
Start: 2016-10-07 — End: 2016-10-07
  Administered 2016-10-07: 4 mg via INTRAVENOUS

## 2016-10-07 MED ORDER — DEXAMETHASONE SODIUM PHOSPHATE 4 MG/ML IJ SOLN
INTRAMUSCULAR | Status: DC | PRN
  Administered 2016-10-07: 10 mg via INTRAVENOUS

## 2016-10-07 MED ORDER — OXYCODONE HCL 5 MG/5ML PO SOLN
5.0000 mg | Freq: Once | ORAL | Status: DC | PRN
  Filled 2016-10-07: qty 5

## 2016-10-07 MED ORDER — CEFAZOLIN SODIUM-DEXTROSE 2-4 GM/100ML-% IV SOLN
2.0000 g | INTRAVENOUS | Status: AC
  Administered 2016-10-07: 2 g via INTRAVENOUS
  Filled 2016-10-07: qty 100

## 2016-10-07 MED ORDER — EPHEDRINE 5 MG/ML INJ
INTRAVENOUS | Status: AC
  Filled 2016-10-07: qty 10

## 2016-10-07 MED ORDER — PROPOFOL 10 MG/ML IV BOLUS
INTRAVENOUS | Status: AC
  Filled 2016-10-07: qty 20

## 2016-10-07 MED ORDER — FENTANYL CITRATE (PF) 100 MCG/2ML IJ SOLN
INTRAMUSCULAR | Status: DC | PRN
  Administered 2016-10-07 (×2): 25 ug via INTRAVENOUS
  Administered 2016-10-07: 50 ug via INTRAVENOUS

## 2016-10-07 MED ORDER — MELOXICAM 15 MG PO TABS
15.0000 mg | ORAL_TABLET | Freq: Every day | ORAL | 0 refills | Status: DC
Start: 2016-10-07 — End: 2017-04-24

## 2016-10-07 MED ORDER — PHENAZOPYRIDINE HCL 200 MG PO TABS: 200.0000 mg | ORAL_TABLET | Freq: Three times a day (TID) | ORAL | 3 refills | Status: DC | PRN

## 2016-10-07 MED ORDER — PHENAZOPYRIDINE HCL 100 MG PO TABS
ORAL_TABLET | ORAL | Status: AC
  Filled 2016-10-07: qty 2

## 2016-10-07 MED ORDER — ACETAMINOPHEN 500 MG PO TABS
1000.0000 mg | ORAL_TABLET | ORAL | Status: AC
  Administered 2016-10-07: 1000 mg via ORAL
  Filled 2016-10-07: qty 2

## 2016-10-07 MED ORDER — EPHEDRINE SULFATE-NACL 50-0.9 MG/10ML-% IV SOSY
PREFILLED_SYRINGE | INTRAVENOUS | Status: DC | PRN
  Administered 2016-10-07 (×2): 15 mg via INTRAVENOUS

## 2016-10-07 MED ORDER — MEPERIDINE HCL 25 MG/ML IJ SOLN
6.2500 mg | INTRAMUSCULAR | Status: DC | PRN
  Filled 2016-10-07: qty 1

## 2016-10-07 MED ORDER — ONDANSETRON HCL 4 MG/2ML IJ SOLN
INTRAMUSCULAR | Status: AC
  Filled 2016-10-07: qty 2

## 2016-10-07 MED ORDER — PROPOFOL 10 MG/ML IV BOLUS
INTRAVENOUS | Status: DC | PRN
  Administered 2016-10-07: 150 mg via INTRAVENOUS

## 2016-10-07 MED ORDER — OXYCODONE HCL 5 MG PO TABS
5.0000 mg | ORAL_TABLET | Freq: Once | ORAL | Status: DC | PRN
  Filled 2016-10-07: qty 1

## 2016-10-07 MED ORDER — PHENAZOPYRIDINE HCL 200 MG PO TABS
200.0000 mg | ORAL_TABLET | Freq: Once | ORAL | Status: AC
  Administered 2016-10-07: 200 mg via ORAL
  Filled 2016-10-07: qty 1

## 2016-10-07 MED ORDER — CEFAZOLIN SODIUM-DEXTROSE 2-4 GM/100ML-% IV SOLN
INTRAVENOUS | Status: AC
  Filled 2016-10-07: qty 100

## 2016-10-07 MED ORDER — SODIUM CHLORIDE 0.9 % IR SOLN
Status: DC | PRN
  Administered 2016-10-07: 18000 mL via INTRAVESICAL

## 2016-10-07 MED ORDER — HYOSCYAMINE SULFATE SL 0.125 MG SL SUBL: 0.1250 mg | SUBLINGUAL_TABLET | Freq: Two times a day (BID) | SUBLINGUAL | 1 refills | Status: DC | PRN

## 2016-10-07 MED ORDER — DEXAMETHASONE SODIUM PHOSPHATE 10 MG/ML IJ SOLN
INTRAMUSCULAR | Status: AC
  Filled 2016-10-07: qty 1

## 2016-10-07 MED ORDER — HYDROMORPHONE HCL 1 MG/ML IJ SOLN
0.2500 mg | INTRAMUSCULAR | Status: DC | PRN
  Filled 2016-10-07: qty 0.5

## 2016-10-07 SURGICAL SUPPLY — 25 items
BAG DRAIN URO-CYSTO SKYTR STRL (DRAIN) ×3 IMPLANT
BAG DRN UROCATH (DRAIN) ×1
BAG URINE DRAINAGE (UROLOGICAL SUPPLIES) ×3 IMPLANT
CATH HEMA 3WAY 30CC 22FR COUDE (CATHETERS) ×2 IMPLANT
CATH HEMA 3WAY 30CC 24FR COUDE (CATHETERS) ×3 IMPLANT
CATH HEMA 3WAY 30CC 24FR RND (CATHETERS) IMPLANT
CLOTH BEACON ORANGE TIMEOUT ST (SAFETY) ×3 IMPLANT
ELECT BIVAP BIPO 22/24 DONUT (ELECTROSURGICAL)
ELECT LOOP MED HF 24F 12D (CUTTING LOOP) IMPLANT
ELECTRD BIVAP BIPO 22/24 DONUT (ELECTROSURGICAL) IMPLANT
GLOVE BIO SURGEON STRL SZ7.5 (GLOVE) ×3 IMPLANT
GOWN STRL REUS W/ TWL XL LVL3 (GOWN DISPOSABLE) ×1 IMPLANT
GOWN STRL REUS W/TWL XL LVL3 (GOWN DISPOSABLE) ×3
HOLDER FOLEY CATH W/STRAP (MISCELLANEOUS) IMPLANT
IV NS IRRIG 3000ML ARTHROMATIC (IV SOLUTION) ×3 IMPLANT
IV SET EXTENSION GRAVITY 40 LF (IV SETS) ×3 IMPLANT
KIT RM TURNOVER CYSTO AR (KITS) ×3 IMPLANT
LASER REVOLIX PROCEDURE (MISCELLANEOUS) ×3 IMPLANT
LOOP CUT BIPOLAR 24F LRG (ELECTROSURGICAL) IMPLANT
MANIFOLD NEPTUNE II (INSTRUMENTS) ×3 IMPLANT
PACK CYSTO (CUSTOM PROCEDURE TRAY) ×3 IMPLANT
SYR 30ML LL (SYRINGE) ×2 IMPLANT
SYRINGE IRR TOOMEY STRL 70CC (SYRINGE) ×2 IMPLANT
TUBE CONNECTING 12'X1/4 (SUCTIONS)
TUBE CONNECTING 12X1/4 (SUCTIONS) IMPLANT

## 2016-10-07 NOTE — H&P (Signed)
Office Visit Report     09/24/2016   --------------------------------------------------------------------------------   [name]  MRN: [id number]  PRIMARY CARE:  Maura L. Hamrick, MD  DOB: 03-22-1932, 83 year old Male  REFERRING:  Taeden Geller I. Gaynelle Arabian, MD  PROVIDER:  Carolan Clines, M.D.    LOCATION:  [facility] [phone]   --------------------------------------------------------------------------------   CC: I have urinary retention.  HPI: Eric Carter is a 83 year-old male established patient who is here for urinary retention.  His problem was diagnosed 08/26/2016. His current symptoms did not begin after he had a surgical procedure. His urinary retention is being treated with foley catheter and flomax. Patient denies suprapubic tube, intemittent catheterization, hytrin, cardura, uroxatrol, rapaflo, avodart, and proscar.   He does not have an abnormal sensation when needing to urinate. He does have to strain or bear down to start his urinary stream. He does not have a good size and strength to his urinary stream. He is having problems with emptying his bladder well. His urine has shut off completely.   He has previously had an indwelling catheter in for more than two weeks at a time.   Urinary retention started after he was took some OTC cold medication.  Urodynamics shows a maximum flow rate of 3 cc/s, with detrusor pressure of excellent flow of 5170s of water pressure (normal 25 syringes were pressure and maximum flow of 25 cc/s). The patient voids often unstable bladder contraction, with pressures reaching 72 cm of water pressure. He has trabeculation, and elevation of the bladder base, and bladder diverticula noted. There is no reflux.     CC: I have bladder atony.  HPI: He does not catheterize intermittently. He does not have a permanent indwelling Foley catheter.   He does not have an abnormal sensation when needing to urinate. He is not currently  having trouble urinating. He does have a good size and strength to his urinary stream. His urinary stream does not start and stop during voiding. He does not dribble at the end of urination.   He is not having problems with emptying his bladder well.   He gets up at night to urinate 1 time. He is not having problems with urinary control or incontinence. He can not get to the bathroom in time when he gets the urge to urinate.   Currently taking Bethanechol 25mg  3 tabs QAM and 2 tabs QHS.     AUA Symptom Score: He never has the sensation of not emptying his bladder completely after finishing urinating. He never has to urinate again less that two hours after he has finished urinating. He does not have to stop and start again several times when he urinates. 50% of the time he finds it difficult to postpone urination. He never has a weak urinary stream. He never has to push or strain to begin urination. He has to get up to urinate 1 time from the time he goes to bed until the time he gets up in the morning.   Calculated AUA Symptom Score: 4    QOL Score: He would feel terrible if he had to live with his urinary condition the way it is now for the rest of his life.   Calculated QOL Symptom Score: 6    ALLERGIES: Codeine Derivatives - Personality Altered lisinopril - cough pravastatin - myalgia    MEDICATIONS: Bethanechol Chloride 25 mg tablet TAKE 3 TABLETS IN THE MORNING AND 2 TABLETS AT BEDTIME  Simvastatin  5 mg tablet  Tamsulosin Hcl 0.4 mg capsule, ext release 24 hr 1 capsule PO Q HS  Aspirin Ec 81 mg tablet, delayed release Oral  Bethanechol Chloride 25 MG Oral Tablet 3 tablets q AM and 2 tablets q hs  Fish Oil  Glimepiride 4 mg tablet Oral  Losartan Potassium 25 mg tablet Oral  Metformin Hcl Er 1,000 mg tablet, extended release 24 hr Oral  Nitrofurantoin 100 mg capsule 1 capsule PO BID  Nitrostat 0.4 mg tablet, sublingual Sublingual  Omeprazole 20 mg capsule,delayed release Oral   Testosterone Cypionate 200 MG/ML Intramuscular Solution 1 ml IM Q2WK  Zetia 10 MG Oral Tablet Oral     GU PSH: Complex cystometrogram, w/ void pressure and urethral pressure profile studies, any technique - 09/16/2016 Complex Uroflow - 09/16/2016 Emg surf Electrd - 09/16/2016 Inject For cystogram - 09/16/2016 Intrabd voidng Press - 09/16/2016      PSH Notes: Cataract Surgery, Tonsillectomy, Cath Laser Angioplasty   NON-GU PSH: Remove Tonsils - 2009    GU PMH: Urinary Retention - 08/28/2016 BPH w/LUTS - 05/03/2016, - 03/06/2016, Benign prostatic hyperplasia with urinary obstruction, - 08/29/2015 Elevated PSA - 05/03/2016, Elevated prostate specific antigen (PSA), - 08/29/2015 Testicular hypofunction - 05/03/2016, - 03/06/2016, Hypogonadism, testicular, - 08/29/2015, Secondary male hypogonadism, - 2015 Bladder, 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, Hypotonic bladder, - 08/29/2015 ED, arterial insufficiency - 03/06/2016, Erectile dysfunction due to arterial insufficiency, - 2016 Encounter for Prostate Cancer screening, Visit For: Screening Exam Malignant Neoplasm Prostate - 2014 Inflammatory Disease Prostate, Unspec, Prostatitis - 2014      PMH Notes:  2007-07-01 11:01:46 - Note: Acute Myocardial Infarction  2007-07-01 11:01:46 - Note: Skin Cancer   NON-GU PMH: Encounter for general adult medical examination without abnormal findings, Encounter for preventive health examination - 08/29/2015 Polyneuropathy, unspecified, Peripheral neuropathy - 08/29/2015 Personal history of other diseases of the digestive system, History of esophageal reflux - 2014 Personal history of other endocrine, nutritional and metabolic disease, History of hypercholesterolemia - 2014, History of diabetes mellitus, - 2014 Personal history of other specified conditions, History of heartburn - 2014    FAMILY HISTORY: Colon Cancer - Father Family Health Status Number - Runs In Family Father Deceased At Age2 ___ - Runs In  Family Mother Deceased At Age 1 from diabetic complicati - Runs In Family   SOCIAL HISTORY: Marital Status: Married Current Smoking Status: Patient has never smoked.  Drinks 1 drink per month. Types of alcohol consumed: Beer.  Drinks 2 caffeinated drinks per day.     Notes: Previous History Of Smoking, Caffeine Use, Marital History - Currently Married, Occupation:, Alcohol Use   REVIEW OF SYSTEMS:    GU Review Male:   Patient reports hard to postpone urination, get up at night to urinate, frequent urination, stream starts and stops, trouble starting your stream, and have to strain to urinate . Patient denies erection problems, burning/ pain with urination, leakage of urine, and penile pain.  Gastrointestinal (Upper):   Patient denies nausea, vomiting, and indigestion/ heartburn.  Gastrointestinal (Lower):   Patient denies diarrhea and constipation.  Constitutional:   Patient denies fever, night sweats, weight loss, and fatigue.  Skin:   Patient denies skin rash/ lesion and itching.  Eyes:   Patient denies blurred vision and double vision.  Ears/ Nose/ Throat:   Patient denies sore throat and sinus problems.  Hematologic/Lymphatic:   Patient denies swollen glands and easy bruising.  Cardiovascular:   Patient denies leg swelling and chest pains.  Respiratory:  Patient denies cough and shortness of breath.  Endocrine:   Patient denies excessive thirst.  Musculoskeletal:   Patient denies back pain and joint pain.  Neurological:   Patient denies headaches and dizziness.  Psychologic:   Patient denies depression and anxiety.   VITAL SIGNS:      09/24/2016 10:18 AM  BP 121/63 mmHg  Pulse 56 /min  Temperature 97.0 F / 36 C   GU PHYSICAL EXAMINATION:    Anus and Perineum: No hemorrhoids. No anal stenosis. No rectal fissure, no anal fissure. No edema, no dimple, no perineal tenderness, no anal tenderness.  Scrotum: No lesions. No edema. No cysts. No warts.  Epididymides: Right: no  spermatocele, no masses, no cysts, no tenderness, no induration, no enlargement. Left: no spermatocele, no masses, no cysts, no tenderness, no induration, no enlargement.  Testes: No tenderness, no swelling, no enlargement left testes. No tenderness, no swelling, no enlargement right testes. Normal location left testes. Normal location right testes. No mass, no cyst, no varicocele, no hydrocele left testes. No mass, no cyst, no varicocele, no hydrocele right testes.  Urethral Meatus: Normal size. No lesion, no wart, no discharge, no polyp. Normal location.  Penis: Penile foley catheter present. Circumcised, no foreskin warts, no cracks. No dorsal peyronie's plaques, no left corporal peyronie's plaques, no right corporal peyronie's plaques, no scarring, no shaft warts. No balanitis, no meatal stenosis.   Prostate: Prostate 4 + size. Left lobe normal consistency, right lobe normal consistency. Symmetrical lobes. No prostate nodule. Left lobe no tenderness, right lobe no tenderness.   Seminal Vesicles: Nonpalpable.  Sphincter Tone: Normal sphincter. No rectal tenderness. No rectal mass.    MULTI-SYSTEM PHYSICAL EXAMINATION:    Constitutional: Well-nourished. No physical deformities. Normally developed. Good grooming.  Neck: Neck symmetrical, not swollen. Normal tracheal position.  Respiratory: No labored breathing, no use of accessory muscles.   Cardiovascular: Normal temperature, normal extremity pulses, no swelling, no varicosities.  Lymphatic: No enlargement of neck, axillae, groin.  Skin: No paleness, no jaundice, no cyanosis. No lesion, no ulcer, no rash.  Neurologic / Psychiatric: Oriented to time, oriented to place, oriented to person. No depression, no anxiety, no agitation.  Gastrointestinal: No mass, no tenderness, no rigidity, non obese abdomen.  Eyes: Normal conjunctivae. Normal eyelids.  Ears, Nose, Mouth, and Throat: Left ear no scars, no lesions, no masses. Right ear no scars, no  lesions, no masses. Nose no scars, no lesions, no masses. Normal hearing. Normal lips.  Musculoskeletal: Normal gait and station of head and neck.     PAST DATA REVIEWED:  Source Of History:  Patient  Records Review:   Previous Patient Records  Urodynamics Review:   Review Urodynamics Tests  X-Ray Review: Prostate Ultrasound: Reviewed Films. Reviewed Report. Discussed With Patient.     05/03/16 06/29/14 02/02/14 01/19/13 03/17/12 08/13/11 08/22/09 09/08/08  PSA  Total PSA 3.71 ng/dl 3.99  2.55  2.28  2.42  6.68  2.98  2.15   Free PSA      1.21     % Free PSA      18       02/29/16 08/22/15 01/18/15 06/29/14 03/19/14 03/09/14 02/02/14 01/19/13  Hormones  Testosterone, Total 542.0 pg/dL 912  627  749  159  683  176  128     PROCEDURES:         Flexible Cystoscopy - 52000  Risks, benefits, and some of the potential complications of the procedure were discussed at length with the patient  including infection, bleeding, voiding discomfort, urinary retention, fever, chills, sepsis, and others. All questions were answered. Informed consent was obtained. Antibiotic prophylaxis was given. Sterile technique and intraurethral analgesia were used.  Meatus:  Normal size. Normal location. Normal condition.  Urethra:  No strictures.  External Sphincter:  Normal.  Verumontanum:  Normal.  Prostate:  Obstructing. Severe hyperplasia. Heavy prostatic regrowth.  Bladder Neck:  Severe bladder neck contracture. Erythema is present.  Ureteral Orifices:  Normal location. Normal size. Normal shape. Effluxed clear urine.  Bladder:  Severe trabeculation. Cellules. Saccules. A diverticulum. Erythematous mucosa. No tumors. No stones.      The lower urinary tract was carefully examined. The procedure was well-tolerated and without complications. Antibiotic instructions were given. Instructions were given to call the office immediately for bloody urine, difficulty urinating, urinary retention, painful or frequent  urination, fever, chills, nausea, vomiting or other illness. The patient stated that he understood these instructions and would comply with them.         Prostate Ultrasound - 07622  Length:6.73cm Height:4.88cm Width:7.06cm Volume:121.21ml  Bladder:  Pt has catheter - balloon visualized  Prostate:  Multiple calcifications seen within prostate largest measuring 0.74cm and 0.59cm and cluster of calcifications at [facility]      The transrectal ultrasound probe is introduced into the rectum, and the prostate is visualized. Ultrasonography is utilized throughout the procedure. At the conclusion of the procedure, the ultrasound probe is removed. The patient tolerates the procedure without complication.   ASSESSMENT:      ICD-10 Details  1 GU:   Urinary Retention, Unspec - R33.9   2   Bladder, Flaccid neuropathic - N31.2           Notes:   83 year old male, with history of recurrent urinary retention. He is status post thermotherapy of the prostate 10 years ago. He hac ha 122 cc prostate, wit high voiding pressure, and very low peak flow.  He complains of incomplete emptying, weakened urinary stream, and urinary urgency. He denies dysuria fever hematuria back or flank pain. He does have chronic constipation. The patient was taking over-the-counter medications for a cold over the last 3-4 weeks. His medications have included tamsulosin. His physical examination showed a 3+ benign prostate with normal sphincter tone. His PSA has been 3.99.   Urodynamics on 09/16/16, shows first sensation at 115 cc, a strong desire to void at 1 and 57 cc. The bladder becomes unstable, with a bladder contraction pressure of 72 cm of water. He voids often unstable contraction. The Valsalva leak point is negative for leakage. Pressure flow study shows a voluntary contraction, with a voided volume of 39 cc and maximal flow rate of 3 cc/s. Detrusor pressure at maximum flow was 51 7 m water pressure. The maximum detrusor pressure was  54 cm of water pressure and the PVR is 360 cc.   VCUG shows trabeculation, but no reflux. There are multiple bladder diverticula seen.   The patient is a maximum bladder capacity of 400 cc. He has unstable bladder contractions, and voids often unstable bladder contractions with urgency. He is able to generate a voluntary contraction, with a well sustained contraction but is only able to void 39 cc. He voids more volume during his voiding with an unstable contraction. This final post void residual is 360 cc. The nomogram indicates obstructed flow pattern.  I have had an extensive conversation with the patient and his wife. He has a very large prostate, and, or nearly, would require an open  prostatectomy, robot-assisted laparoscopic prostatectomy. However, at age 71, it is his wish to avoid the large surgery. Another option would be to begin his surgery with a "staged" transurethral prostatectomy, possibly with a thulium laser procedure. He does not need tissue for pathology. He may be able to void adequately with simply one half of his prostate resected. There is a fine-line in his case between incontinence, and ability to drain his bladder. His cystoscopy shows edematous bladder, which is end-stage and its severe fibrotic trabeculated cellules. This could be accomplished as an outpatient, with catheter for 3-4 days. The patient is wife are in agreement with the concept of staged [facility] laser prostatectomy.    PLAN:           Schedule Return Visit/Planned Activity: Next Available Appointment - PT/OT Referral  Return Visit/Planned Activity: Next Available Appointment - Schedule Surgery  Return Visit/Planned Activity: Next Available Appointment - PT/OT Referral             Note: pre-op evaluation          Document Letter(s):  Created for Patient: Clinical Summary         Notes:   physical therapy both before and after prostatectomy    Signed by Carolan Clines, M.D. on 09/24/16 at 2:20 PM  (EDT)     The information contained in this medical record document is considered private and confidential patient information. This information can only be used for the medical diagnosis and/or medical services that are being provided by the patient's selected caregivers. This information can only be distributed outside of the patient's care if the patient agrees and signs waivers of authorization for this information to be sent to an outside source or route.

## 2016-10-07 NOTE — Anesthesia Procedure Notes (Signed)
Procedure Name: LMA Insertion Date/Time: 10/07/2016 11:00 AM Performed by: Candida Peeling RAY Pre-anesthesia Checklist: Patient identified, Emergency Drugs available, Suction available and Patient being monitored Patient Re-evaluated:Patient Re-evaluated prior to inductionOxygen Delivery Method: Circle system utilized Preoxygenation: Pre-oxygenation with 100% oxygen Intubation Type: IV induction Ventilation: Mask ventilation without difficulty LMA: LMA inserted LMA Size: 5.0 Number of attempts: 1 Airway Equipment and Method: Bite block Placement Confirmation: positive ETCO2 Tube secured with: Tape Dental Injury: Teeth and Oropharynx as per pre-operative assessment

## 2016-10-07 NOTE — Anesthesia Preprocedure Evaluation (Signed)
Anesthesia Evaluation  Patient identified by MRN, date of birth, ID band Patient awake    Reviewed: Allergy & Precautions, NPO status , Patient's Chart, lab work & pertinent test results  Airway Mallampati: II  TM Distance: >3 FB Neck ROM: Full    Dental no notable dental hx.    Pulmonary neg pulmonary ROS, sleep apnea , former smoker,    Pulmonary exam normal breath sounds clear to auscultation       Cardiovascular hypertension, + CAD  negative cardio ROS Normal cardiovascular exam Rhythm:Regular Rate:Normal     Neuro/Psych negative neurological ROS  negative psych ROS   GI/Hepatic negative GI ROS, Neg liver ROS, GERD  ,  Endo/Other  negative endocrine ROSdiabetes  Renal/GU negative Renal ROS  negative genitourinary   Musculoskeletal negative musculoskeletal ROS (+)   Abdominal   Peds negative pediatric ROS (+)  Hematology negative hematology ROS (+)   Anesthesia Other Findings   Reproductive/Obstetrics negative OB ROS                             Anesthesia Physical Anesthesia Plan  ASA: III  Anesthesia Plan: General   Post-op Pain Management:    Induction: Intravenous  Airway Management Planned: LMA  Additional Equipment:   Intra-op Plan:   Post-operative Plan: Extubation in OR  Informed Consent: I have reviewed the patients History and Physical, chart, labs and discussed the procedure including the risks, benefits and alternatives for the proposed anesthesia with the patient or authorized representative who has indicated his/her understanding and acceptance.   Dental advisory given  Plan Discussed with: CRNA  Anesthesia Plan Comments:         Anesthesia Quick Evaluation

## 2016-10-07 NOTE — Op Note (Signed)
Pre-operative diagnosis :   Urinary retention 2ndary BPH  Postoperative diagnosis:  same  Operation: Staged Thulium Laser prostatectomy  Surgeon:  S. Gaynelle Arabian, MD  First assistant;  none  Anesthesia: General Pre-op Tylenol:  Yes B/O Suppository:  Yes Post op Toradol:  No  Preparation: After appropriate preanesthesia, the patient was brought the operative room, placed on the operating table in the dorsal supine position where general LMA anesthesia was introduced. He was then replaced in the dorsal lithotomy position with pubis was prepped with Betadine solution and draped in usual fashion. The arm and was double checked the history was double checked.   Review history:  HPI: Eric Carter is a 83 year-old male established patient who is here for urinary retention.  His problem was diagnosed 08/26/2016. His current symptoms did not begin after he had a surgical procedure. His urinary retention is being treated with foley catheter and flomax. Patient denies suprapubic tube, intemittent catheterization, hytrin, cardura, uroxatrol, rapaflo, avodart, and proscar.   He does not have an abnormal sensation when needing to urinate. He does have to strain or bear down to start his urinary stream. He does not have a good size and strength to his urinary stream. He is having problems with emptying his bladder well. His urine has shut off completely.   He has previously had an indwelling catheter in for more than two weeks at a time.   Urinary retention started after he was took some OTC cold medication.  Urodynamics shows a maximum flow rate of 3 cc/s, with detrusor pressure of excellent flow of 5170s of water pressure (normal 25 syringes were pressure and maximum flow of 25 cc/s). The patient voids often unstable bladder contraction, with pressures reaching 72 cm of water pressure. He has trabeculation, and elevation of the bladder base, and bladder diverticula noted. There is no reflux.    Statement of  Likelihood of Success: Excellent. TIME-OUT observed.:  Procedure:  Cystourethroscopy was accomplished, showing that the patient had massive trilobar BPH, with some elevation of the bladder neck. The trigone was in normal position, and clear reflux is seen from the orifices. Moderate trabeculation was noted. There was no evidence of cellules, or saccules, or diverticular formation. There was no evidence of bladder stones or tumors. The prostate was bleeding at the time of cystoscopy.  The 1000  fiber was selected, and with the settings of 100-150 J of energy, a trench was made from the 8:00 to the 6 clock position, stopping one centimeter proximal to the vera to. The right lateral lobe was lased, as well as the median lobe. Bleeding was lased as well, using the coagulation panel. Because of continued bleeding, because the entire prostate could not be lased at one sitting, I elected to stop the procedure, irrigate the bladder, and passed a 22 [place] three-way hematuria catheter, with traction. No clots were noted. The patient will be sent to the recovery room with continuous flow and traction, and will be discharged from the recovery room with traction only. He will remove this in 24 hours. He received a B and O suppository, as well as by mouth Tylenol preoperatively. He did not receive any Toradol because of his bloody prostate. He was awakened and taken to recovery room in good condition.

## 2016-10-07 NOTE — Anesthesia Postprocedure Evaluation (Signed)
Anesthesia Post Note  Patient: MAXIMILIANO CROMARTIE  Procedure(s) Performed: Procedure(s) (LRB): THULIUM LASER TURP (TRANSURETHRAL RESECTION OF PROSTATE) (N/A)  Patient location during evaluation: PACU Anesthesia Type: General Level of consciousness: awake and alert Pain management: pain level controlled Vital Signs Assessment: post-procedure vital signs reviewed and stable Respiratory status: spontaneous breathing, nonlabored ventilation and respiratory function stable Cardiovascular status: blood pressure returned to baseline and stable Postop Assessment: no signs of nausea or vomiting Anesthetic complications: no       Last Vitals:  Vitals:   10/07/16 1300 10/07/16 1315  BP: 133/67 (!) 141/66  Pulse: (!) 53 (!) 55  Resp: (!) 9 (!) 9  Temp:      Last Pain:  Vitals:   10/07/16 1315  TempSrc:   PainSc: 3                  Lynda Rainwater

## 2016-10-07 NOTE — Discharge Instructions (Addendum)
[facility] Laser Prostate Treatment Green light laser therapy is a procedure that uses a high-energy laser to get rid of extra prostate tissue by turning the tissue into a vapor. It is less invasive than traditional methods of prostate surgery, which involve cutting out the prostate tissue. Because the tissue is turned into a vapor (vaporized) rather than cut out, there is generally less blood loss. This surgery is used to treat an enlarged prostate gland (benign prostatic hyperplasia). Tell a health care provider about:  Any allergies you have.  All medicines you are taking, including vitamins, herbs, eye drops, creams, and over-the-counter medicines.  Any problems you or family members have had with anesthetic medicines.  Any blood disorders you have.  Any surgeries you have had.  Any medical conditions you have. What are the risks? Generally, this is a safe procedure. However, problems may occur, including:  Infection.  Bleeding.  Allergic reaction to medicines.  Damage to other structures or organs.  Blood in the urine (hematuria).  Painful urination.  Urinary tract infection.  Erectile dysfunction (rare).  Dry ejaculation.  Scar tissue in the urinary passage. What happens before the procedure? Staying hydrated  Follow instructions from your health care provider about hydration, which may include:  Up to 2 hours before the procedure - you may continue to drink clear liquids, such as water, clear fruit juice, black coffee, and plain tea. Eating and drinking restrictions  Follow instructions from your health care provider about eating and drinking, which may include:  8 hours before the procedure - stop eating heavy meals or foods such as meat, fried foods, or fatty foods.  6 hours before the procedure - stop eating light meals or foods, such as toast or cereal.  6 hours before the procedure - stop drinking milk or drinks that contain milk.  2 hours before the  procedure - stop drinking clear liquids. Medicines   Ask your health care provider about:  Changing or stopping your regular medicines. This is especially important if you are taking diabetes medicines or blood thinners.  Taking medicines such as aspirin and ibuprofen. These medicines can thin your blood. Do not take these medicines before your procedure if your health care provider instructs you not to.  You may be prescribed antibiotic medicine. If so, take your antibiotic as told by your health care provider. Do not stop taking the antibiotic even if you start to feel better. General instructions   Plan to have someone take you home from the hospital or clinic.  If you will be going home right after the procedure, plan to have someone with you for 24 hours. What happens during the procedure?  To reduce your risk of infection:  Your health care team will wash or sanitize their hands.  Your skin will be washed with soap.  You will be given one or more of the following:  A medicine to help you relax (sedative).  A medicine to make you fall asleep (general anesthetic).  A medicine that is injected into your spine to numb the area below and slightly above the injection site (spinal anesthetic).  A tube containing viewing scopes and instruments (fiber-optic scope) will be inserted through your penis.  A thin fiber will be put through the tube and positioned next to the extra prostate tissue.  Pulses of laser light will come from the end of the fiber and be projected onto the extra tissue. Your blood will absorb the light, become hot, and vaporize  the extra prostate tissue.  The heat from the laser beam will seal off the blood vessels, which will lessen bleeding.  The fiber-optic scope will be removed and replaced with a temporary tube (catheter) that is used to help urine flow. The procedure may vary among health care providers and hospitals. What happens after the  procedure?  Your blood pressure, heart rate, breathing rate, and blood oxygen level will be monitored until the medicines you were given have worn off.  Depending on factors such as the amount of prostate tissue that was vaporized, the strength of your bladder, and the amount of bleeding expected, your catheter may be removed.  You may be given elastic support stockings to wear to help prevent blood clots in your legs.  Do not drive for 24 hours if you were given a sedative, or for as long as directed by your health care provider. Summary  Green light laser therapy is a procedure that uses a high-energy laser that turns extra prostate tissue into a vapor.  This procedure is less invasive than traditional methods of prostate surgery.  Follow instructions from your health care provider about eating and drinking before the procedure.  Pulses of laser light will come from the end of a thin fiber and be aimed at the extra prostate tissue. Your blood will absorb the light, become hot, and vaporize the extra tissue. This information is not intended to replace advice given to you by your health care provider. Make sure you discuss any questions you have with your health care provider. Document Released: 09/10/2007 Document Revised: 06/22/2016 Document Reviewed: 06/22/2016 Elsevier Interactive Patient Education  2017 [employer].   Acute Urinary Retention, Male Acute urinary retention is the temporary inability to urinate. This is a common problem in older men. As men age their prostates become larger and block the flow of urine from the bladder. This is usually a problem that has come on gradually. Follow these instructions at home: If you are sent home with a Foley catheter and a drainage system, you will need to discuss the best course of action with your health care provider. While the catheter is in, maintain a good intake of fluids. Keep the drainage bag emptied and lower than your  catheter. This is so that contaminated urine will not flow back into your bladder, which could lead to a urinary tract infection. There are two main types of drainage bags. One is a large bag that usually is used at night. It has a good capacity that will allow you to sleep through the night without having to empty it. The second type is called a leg bag. It has a smaller capacity, so it needs to be emptied more frequently. However, the main advantage is that it can be attached by a leg strap and can go underneath your clothing, allowing you the freedom to move about or leave your home. Only take over-the-counter or prescription medicines for pain, discomfort, or fever as directed by your health care provider. Contact a health care provider if:  You develop a low-grade fever.  You experience spasms or leakage of urine with the spasms. Get help right away if:  You develop chills or fever.  Your catheter stops draining urine.  Your catheter falls out.  You start to develop increased bleeding that does not respond to rest and increased fluid intake. This information is not intended to replace advice given to you by your health care provider. Make sure you discuss any  questions you have with your health care provider. Document Released: 09/09/2000 Document Revised: 11/15/2015 Document Reviewed: 11/12/2012 Elsevier Interactive Patient Education  2017 [employer]   Benign Prostatic Hyperplasia Benign prostatic hyperplasia is when the prostate gland is bigger than normal (enlarged). The prostate is a gland that produces the fluid that goes into semen. It is near the opening to the bladder and it surrounds the tube that drains urine out of the body (urethra). Benign prostatic hyperplasia is common among older men and it typically causes problems with urinating. The prostate grows slowly as you age. As the prostate grows, it can pinch the urethra. This causes the bladder to work too hard to pass  urine, which leads to a thickened bladder wall. The bladder may eventually become weak and unable to empty completely. What are the causes? The exact cause of this condition is not known. It may be related to changes in hormones as the body ages. What increases the risk? You are more likely to develop this condition if:  You have a family history of the condition.  You are age 51 or older.  You have a history of erectile dysfunction.  You do not exercise.  You have certain medical conditions, including:  Type 2 diabetes.  Obesity.  Heart and circulatory disease. What are the signs or symptoms? Symptoms of this condition include:  Weak or interrupted urine stream.  Dribbling or leaking urine.  Feeling like the bladder has not emptied completely.  Difficulty starting urination.  Getting up frequently at night to urinate.  Urinating more often (8 or more times a day).  Accidental loss of urine (urinary incontinence).  Pain during urination or ejaculation.  Urine with an unusual smell or color. The size of the prostate does not always determine the severity of the symptoms. For example, a man with a large prostate may experience minor symptoms, or a man with a smaller prostate may experience a severe blockage. How is this diagnosed? This condition may be diagnosed based on:  Your medical history and symptoms.  A physical exam. This usually includes a digital rectal exam. During this exam, your health care provider places a gloved, lubricated finger into the rectum to feel the size of the prostate.  A blood test. This test checks for high levels of a protein that is produced by the prostate (prostate specific antigen, PSA).  Tests to examine how well the urethra and bladder are functioning (urodynamic tests).  Cystoscopy. For this test, a small, tube-shaped instrument (cystoscope) is used to look inside the urethra and bladder. The cystoscope is placed into the urinary  tract through the opening at the tip of the penis.  Urine tests.  Ultrasound. How is this treated? Treatment for this condition depends on how severe your symptoms are. Treatment may include:  Active surveillance or "watchful waiting." If your symptoms are mild, your health care provider may delay treatment and ask you to keep track of your symptoms. You will have regular checkups to examine the size of your prostate, discuss symptoms, and determine whether treatment is needed.  Medicines. These may be used to:  Stop prostate growth.  Shrink the prostate.  Relieve symptoms.  Lifestyle changes, including:  Pelvic floor muscle exercises. The pelvic floor muscles are a group of muscles that relax when you urinate.  Bladder training. This involves exercises that train the bladder to hold more urine for longer periods.  Reducing the amount of liquid that you drink. This is especially important  before sleeping and before long periods of time spent in public.  Reducing the amount of caffeine and alcohol that you drink.  Treating or preventing constipation.  Surgery to reduce the size of the prostate or widen the urethra. This is typically done if your symptoms are severe or there are serious complications from the enlarged prostate. Follow these instructions at home: Medicines   Take over-the-counter and prescription medicines as told by your health care provider.  Avoid certain medicines, such as decongestants, antihistamines, and some prescription medicines as told by your health care provider. Ask your health care provider which medicines you should avoid. General instructions   Monitor your symptoms for any changes. Tell your health care provider about any changes.  Give yourself time when you urinate.  Avoid certain beverages that can irritate the bladder, such as:  Alcohol.  Caffeinated drinks like coffee, tea, and cola.  Avoid drinking large amounts of liquid before  bed or before going out in public.  Do pelvic floor muscle or bladder training exercises as told by your health care provider.  Keep all follow-up visits as told by your health care provider. This is important. Contact a health care provider if:  Your develop new or worse symptoms.  You have trouble getting or maintaining an erection.  You have a fever.  You have pain or burning during urination.  You have blood in your urine. Get help right away if:  You have severe pain when urinating.  You cannot urinate.  You have severe pain in your abdomen.  You are dizzy.  You faint.  You have severe back pain.  Your urine is dark red and difficult to see through.  You have large blood clots in your urine.  You have severe pain after an erection.  You have chest pain, dizziness, or nausea during sexual activity. Summary  The prostate is a gland that produces the fluid that goes into semen. It is near the opening to the bladder and it surrounds the tube that drains urine out of the body (urethra).  Benign prostatic hyperplasia is common among older men and it typically causes problems with urinating.  If your symptoms are mild, your health care provider may delay treatment and ask you to keep track of your symptoms. You will have regular checkups to examine the size of your prostate, discuss symptoms, and determine whether treatment is needed.  If directed, you may need to avoid certain medicines, such as decongestants, antihistamines, and some prescription medicines.  Contact your health care provider if you develop new or worse symptoms. This information is not intended to replace advice given to you by your health care provider. Make sure you discuss any questions you have with your health care provider. Document Released: 06/03/2005 Document Revised: 04/22/2016 Document Reviewed: 04/22/2016 Elsevier Interactive Patient Education  2017 [facility]  Care Instructions  Activity: Get plenty of rest for the remainder of the day. A responsible individual must stay with you for 24 hours following the procedure.  For the next 24 hours, DO NOT: -Drive a car -Paediatric nurse -Drink alcoholic beverages -Take any medication unless instructed by your physician -Make any legal decisions or sign important papers.  Meals: Start with liquid foods such as gelatin or soup. Progress to regular foods as tolerated. Avoid greasy, spicy, heavy foods. If nausea and/or vomiting occur, drink only clear liquids until the nausea and/or vomiting subsides. Call your physician if vomiting continues.  Special Instructions/Symptoms: Your  throat may feel dry or sore from the anesthesia or the breathing tube placed in your throat during surgery. If this causes discomfort, gargle with warm salt water. The discomfort should disappear within 24 hours.  If you had a scopolamine patch placed behind your ear for the management of post- operative nausea and/or vomiting:  1. The medication in the patch is effective for 72 hours, after which it should be removed.  Wrap patch in a tissue and discard in the trash. Wash hands thoroughly with soap and water. 2. You may remove the patch earlier than 72 hours if you experience unpleasant side effects which may include dry mouth, dizziness or visual disturbances. 3. Avoid touching the patch. Wash your hands with soap and water after contact with the patch.

## 2016-10-07 NOTE — Interval H&P Note (Signed)
History and Physical Interval Note:  10/07/2016 10:50 AM  Eric Carter  has presented today for surgery, with the diagnosis of BENIGN PROSTATIC HYPERPLASIA  The various methods of treatment have been discussed with the patient and family. After consideration of risks, benefits and other options for treatment, the patient has consented to  Procedure(s): THULIUM LASER TURP (TRANSURETHRAL RESECTION OF PROSTATE) (N/A) as a surgical intervention .  The patient's history has been reviewed, patient examined, no change in status, stable for surgery.  I have reviewed the patient's chart and labs.  Questions were answered to the patient's satisfaction.     Eric Carter I Zayvian Mcmurtry

## 2016-10-07 NOTE — Transfer of Care (Signed)
Last Vitals:  Vitals:   10/07/16 0855  BP: (!) 126/54  Pulse: (!) 52  Resp: 19  Temp: 36.3 C    Last Pain:  Vitals:   10/07/16 0855  TempSrc: Oral      Patients Stated Pain Goal: 5 (10/07/16 0912)  Immediate Anesthesia Transfer of Care Note  Patient: Eric Carter  Procedure(s) Performed: Procedure(s) (LRB): THULIUM LASER TURP (TRANSURETHRAL RESECTION OF PROSTATE) (N/A)  Patient Location: PACU  Anesthesia Type: General  Level of Consciousness: awake, alert  and oriented  Airway & Oxygen Therapy: Patient Spontanous Breathing and Patient connected to nasal cannula oxygen  Post-op Assessment: Report given to PACU RN and Post -op Vital signs reviewed and stable  Post vital signs: Reviewed and stable  Complications: No apparent anesthesia complications

## 2016-10-08 ENCOUNTER — Encounter (HOSPITAL_BASED_OUTPATIENT_CLINIC_OR_DEPARTMENT_OTHER): Payer: Self-pay | Admitting: Urology

## 2016-10-11 ENCOUNTER — Encounter (HOSPITAL_COMMUNITY): Payer: Self-pay | Admitting: Emergency Medicine

## 2016-10-11 ENCOUNTER — Emergency Department (HOSPITAL_COMMUNITY)
Admission: EM | Admit: 2016-10-11 | Discharge: 2016-10-12 | Disposition: A | Discharge status: Home / Self Care | Payer: Medicare Other | Attending: Emergency Medicine | Admitting: Emergency Medicine

## 2016-10-11 DIAGNOSIS — Z7984 Long term (current) use of oral hypoglycemic drugs: Secondary | ICD-10-CM | POA: Diagnosis not present

## 2016-10-11 DIAGNOSIS — R339 Retention of urine, unspecified: Secondary | ICD-10-CM | POA: Diagnosis not present

## 2016-10-11 DIAGNOSIS — Z87891 Personal history of nicotine dependence: Secondary | ICD-10-CM | POA: Diagnosis not present

## 2016-10-11 DIAGNOSIS — N3090 Cystitis, unspecified without hematuria: Secondary | ICD-10-CM | POA: Insufficient documentation

## 2016-10-11 DIAGNOSIS — I2510 Atherosclerotic heart disease of native coronary artery without angina pectoris: Secondary | ICD-10-CM | POA: Diagnosis not present

## 2016-10-11 DIAGNOSIS — E119 Type 2 diabetes mellitus without complications: Secondary | ICD-10-CM | POA: Insufficient documentation

## 2016-10-11 DIAGNOSIS — I10 Essential (primary) hypertension: Secondary | ICD-10-CM | POA: Insufficient documentation

## 2016-10-11 DIAGNOSIS — T83098A Other mechanical complication of other indwelling urethral catheter, initial encounter: Secondary | ICD-10-CM | POA: Diagnosis not present

## 2016-10-11 DIAGNOSIS — R319 Hematuria, unspecified: Secondary | ICD-10-CM | POA: Diagnosis not present

## 2016-10-11 NOTE — ED Provider Notes (Signed)
[facility] DEPT Provider Note   CSN: [id number] Arrival date & time: 10/11/16  2239   By signing my name below, I, [name], attest that this documentation has been prepared under the direction and in the presence of Orpah Greek, MD  Electronically Signed: [name], ED Scribe. 10/11/16. 11:48 PM.   History   Chief Complaint Chief Complaint  Patient presents with  . Urinary Retention    HPI Comments:  Eric Carter is a 83 y.o. male who presents to the Emergency Department complaining of acute onset, gradually worsening difficulty urinating x today. Pt states he has been retaining urine for 2-3 hours PTA. He also reports mild hematuria with small blood clots and abdominal pain. Pt recently had prostate surgery on 10/07/2016. His catheter was removed yesterday. No alleviating or exacerbating factors noted. Pt denies any other associated symptoms. No other complaints noted at this time.   The history is provided by the patient. No language interpreter was used.    Past Medical History:  Diagnosis Date  . BPH (benign prostatic hyperplasia)   . Chronic constipation   . Coronary artery disease cardiologist-  dr Daneen Schick   hx STEMI inferior  in 2007, distla RCA angioplasty  . First degree heart block   . GERD (gastroesophageal reflux disease)   . History of atrial fibrillation without current medication    episode Atrial Fib. during cardiac cath. 02-28-2006 resolved before discharge  . History of ST elevation myocardial infarction (STEMI)    02-28-2006  inferior STEMI  s/p  RCA angioplasty  . History of urinary retention    03/ 2012;  03/ 2018  . Hyperlipidemia   . Hypertension   . OSA on CPAP    per sleetp study 10-08-2015 Moderate with AHI 19/hr now on CPAP  . RBBB (right bundle branch block)   . S/P PTCA (percutaneous transluminal coronary angioplasty) 02/28/2006   distal RCA  . Self-catheterizes urinary bladder   . Type 2 diabetes mellitus ([place])     . Urinary retention 08/2016   self -- cath  . Wears glasses     Patient Active Problem List   Diagnosis Date Noted  . OSA (obstructive sleep apnea) 01/25/2016  . Obesity (BMI 30.0-34.9) 01/25/2016  . Diabetes type 2, controlled ([place]) 07/28/2015  . Medication management 07/28/2015  . Coronary atherosclerosis of native coronary artery 07/09/2013  . Essential hypertension, benign 07/09/2013  . Hyperlipidemia 07/09/2013  . Reflux esophagitis 07/09/2013    Past Surgical History:  Procedure Laterality Date  . CARDIAC CATHETERIZATION  02-28-2006  dr Daneen Schick   PCI distal RCA /  mLAD  50%,  inferior akinesis, develpment of atrial fibrillation during case,  ef 50%  . CARDIOVASCULAR STRESS TEST  09/20/2002   normal nuclear study w/ no ischemia (diaphragmatic attenuation of the inferior wall)/  normal LV function and wall motion, ef 72%  . CATARACT EXTRACTION W/ INTRAOCULAR LENS  IMPLANT, BILATERAL  2015  . COLONOSCOPY  last one 07-02-2013  . THULIUM LASER TURP (TRANSURETHRAL RESECTION OF PROSTATE) N/A 10/07/2016   Procedure: THULIUM LASER TURP (TRANSURETHRAL RESECTION OF PROSTATE);  Surgeon: Carolan Clines, MD;  Location: [facility];  Service: Urology;  Laterality: N/A;  . TONSILLECTOMY  child       Home Medications    Prior to Admission medications   Medication Sig Start Date End Date Taking? Authorizing Provider  Coenzyme Q10 (COQ-10) 200 MG CAPS Take 1 capsule by mouth every morning.   Yes  Historical Provider, MD  glimepiride (AMARYL) 4 MG tablet Take 4 mg by mouth 2 (two) times daily.    Yes Historical Provider, MD  Hyoscyamine Sulfate SL (LEVSIN/SL) 0.125 MG SUBL Place 0.125 mg under the tongue 2 (two) times daily between meals as needed. Patient taking differently: Place 0.125 mg under the tongue 2 (two) times daily between meals as needed (cramps).  10/07/16  Yes Carolan Clines, MD  losartan (COZAAR) 25 MG tablet TAKE 1 TABLET (25 MG TOTAL) BY MOUTH  DAILY. Patient taking differently: Take 25 mg by mouth every morning.  07/29/16  Yes [name], MD  meloxicam (MOBIC) 15 MG tablet Take 1 tablet (15 mg total) by mouth daily. 10/07/16  Yes Carolan Clines, MD  metFORMIN (GLUMETZA) 500 MG (MOD) 24 hr tablet Take 500-1,000 mg by mouth 2 (two) times daily with a meal. One tablet in the morning, two tablets in the evening   Yes Historical Provider, MD  Omega-3 Fatty Acids (FISH OIL) 1200 MG CAPS Take 1 capsule by mouth 2 (two) times daily.   Yes Historical Provider, MD  omeprazole (PRILOSEC) 20 MG capsule Take 20 mg by mouth every morning.    Yes Historical Provider, MD  phenazopyridine (PYRIDIUM) 200 MG tablet Take 1 tablet (200 mg total) by mouth 3 (three) times daily as needed for pain. For urinary burning Note: will stain clothing 10/07/16  Yes Carolan Clines, MD  pioglitazone (ACTOS) 30 MG tablet Take 30 mg by mouth every morning.    Yes Historical Provider, MD  Probiotic Product (PROBIOTIC DAILY) CAPS Take 1 capsule by mouth 2 (two) times daily.   Yes Historical Provider, MD  simvastatin (ZOCOR) 5 MG tablet Take 5 mg by mouth every other day. Takes in the evening   Yes Historical Provider, MD  tamsulosin (FLOMAX) 0.4 MG CAPS capsule Take 0.4 mg by mouth at bedtime.  08/28/16  Yes Historical Provider, MD  cephALEXin (KEFLEX) 500 MG capsule Take 1 capsule (500 mg total) by mouth 2 (two) times daily. 10/12/16   Orpah Greek, MD    Family History Family History  Problem Relation Age of Onset  . Colon cancer Father   . Stomach cancer Neg Hx     Social History Social History  Substance Use Topics  . Smoking status: Former Smoker    Types: Cigars    Quit date: 10/02/1970  . Smokeless tobacco: Current User    Types: Chew     Comment: per pt occasionally chews tobacco  . Alcohol use No     Allergies   Lisinopril; Codeine; Pravastatin sodium; and Simvastatin   Review of Systems Review of Systems  Gastrointestinal:  Positive for abdominal pain.  Genitourinary: Positive for decreased urine volume, difficulty urinating and hematuria.  All other systems reviewed and are negative.    Physical Exam Updated Vital Signs BP 139/66 (BP Location: Right Arm)   Pulse 67   Temp 98.8 F (37.1 C) (Oral)   Resp 18   Wt 220 lb (99.8 kg)   SpO2 92%   BMI 29.84 kg/m   Physical Exam  Constitutional: He is oriented to person, place, and time. He appears well-developed and well-nourished. No distress.  HENT:  Head: Normocephalic and atraumatic.  Right Ear: Hearing normal.  Left Ear: Hearing normal.  Nose: Nose normal.  Mouth/Throat: Oropharynx is clear and moist and mucous membranes are normal.  Eyes: Conjunctivae and EOM are normal. Pupils are equal, round, and reactive to light.  Neck: Normal range  of motion. Neck supple.  Cardiovascular: Regular rhythm, S1 normal and S2 normal.  Exam reveals no gallop and no friction rub.   No murmur heard. Pulmonary/Chest: Effort normal and breath sounds normal. No respiratory distress. He exhibits no tenderness.  Abdominal: Soft. Normal appearance and bowel sounds are normal. He exhibits distension. There is no hepatosplenomegaly. There is tenderness in the suprapubic area. There is no rebound, no guarding, no tenderness at McBurney's point and negative Murphy's sign. No hernia.  Musculoskeletal: Normal range of motion.  Neurological: He is alert and oriented to person, place, and time. He has normal strength. No cranial nerve deficit or sensory deficit. Coordination normal. GCS eye subscore is 4. GCS verbal subscore is 5. GCS motor subscore is 6.  Skin: Skin is warm, dry and intact. No rash noted. No cyanosis.  Psychiatric: He has a normal mood and affect. His speech is normal and behavior is normal. Thought content normal.  Nursing note and vitals reviewed.    ED Treatments / Results  DIAGNOSTIC STUDIES:  Oxygen Saturation is 97% on RA, normal by my interpretation.      COORDINATION OF CARE:  11:39 PM Discussed treatment plan with pt at bedside and pt agreed to plan.  Labs (all labs ordered are listed, but only abnormal results are displayed) Labs Reviewed  URINALYSIS, ROUTINE W REFLEX MICROSCOPIC - Abnormal; Notable for the following:       Result Value   Color, Urine AMBER (*)    APPearance CLOUDY (*)    Hgb urine dipstick LARGE (*)    Protein, ur 30 (*)    Nitrite POSITIVE (*)    Leukocytes, UA MODERATE (*)    Bacteria, UA MANY (*)    All other components within normal limits  URINE CULTURE    EKG  EKG Interpretation None       Radiology No results found.  Procedures Procedures (including critical care time)  Medications Ordered in ED Medications  cefTRIAXone (ROCEPHIN) injection 1 g (not administered)     Initial Impression / Assessment and Plan / ED Course  I have reviewed the triage vital signs and the nursing notes.  Pertinent labs & imaging results that were available during my care of the patient were reviewed by me and considered in my medical decision making (see chart for details).     Patient presents to the ER for evaluation of urinary retention. Patient underwent TURP on April 23. Patient had his Foley catheter removed yesterday. He had been noticing some blood in his urine over the course of the last 24 hours, today he started having decreased urine output and in the last few hours he has been unable to urinate. Patient complaining of discomfort in the area of the bladder. No fever, chills, nausea, vomiting.  Foley catheter was placed and his bladder was decompressed. Symptoms have resolved. Urinalysis does appear infected, although this might all be bleeding and inflammatory after the procedure. Patient administered Rocephin IM, urine is cultured. He will be discharged on Keflex, follow-up with his urologist. Patient given return precautions for any fever or worsening symptoms.  Final Clinical Impressions(s) /  ED Diagnoses   Final diagnoses:  Urinary retention  Cystitis    New Prescriptions New Prescriptions   CEPHALEXIN (KEFLEX) 500 MG CAPSULE    Take 1 capsule (500 mg total) by mouth 2 (two) times daily.  I personally performed the services described in this documentation, which was scribed in my presence. The recorded information has been reviewed  and is accurate.      Orpah Greek, MD 10/12/16 [phone]

## 2016-10-11 NOTE — ED Triage Notes (Signed)
Pt states he had laser prostate surgery on Monday and had his catheter removed on Thursday  Pt states he has had difficulty voiding since then but he has been completely unable to pass any urine in the past 2 to 3 hours  Pt is pale and diaphoretic in triage

## 2016-10-12 ENCOUNTER — Encounter (HOSPITAL_COMMUNITY): Payer: Self-pay

## 2016-10-12 ENCOUNTER — Emergency Department (HOSPITAL_COMMUNITY)
Admission: EM | Admit: 2016-10-12 | Discharge: 2016-10-12 | Disposition: A | Discharge status: Home / Self Care | Payer: Medicare Other | Source: Home / Self Care | Attending: Emergency Medicine | Admitting: Emergency Medicine

## 2016-10-12 DIAGNOSIS — I2510 Atherosclerotic heart disease of native coronary artery without angina pectoris: Secondary | ICD-10-CM

## 2016-10-12 DIAGNOSIS — T83091A Other mechanical complication of indwelling urethral catheter, initial encounter: Secondary | ICD-10-CM

## 2016-10-12 DIAGNOSIS — T83098A Other mechanical complication of other indwelling urethral catheter, initial encounter: Secondary | ICD-10-CM

## 2016-10-12 DIAGNOSIS — Z79899 Other long term (current) drug therapy: Secondary | ICD-10-CM

## 2016-10-12 DIAGNOSIS — Y638 Failure in dosage during other surgical and medical care: Secondary | ICD-10-CM | POA: Insufficient documentation

## 2016-10-12 DIAGNOSIS — Z7984 Long term (current) use of oral hypoglycemic drugs: Secondary | ICD-10-CM

## 2016-10-12 DIAGNOSIS — E119 Type 2 diabetes mellitus without complications: Secondary | ICD-10-CM

## 2016-10-12 DIAGNOSIS — I252 Old myocardial infarction: Secondary | ICD-10-CM

## 2016-10-12 DIAGNOSIS — R319 Hematuria, unspecified: Secondary | ICD-10-CM

## 2016-10-12 DIAGNOSIS — R339 Retention of urine, unspecified: Secondary | ICD-10-CM | POA: Diagnosis not present

## 2016-10-12 DIAGNOSIS — Z87891 Personal history of nicotine dependence: Secondary | ICD-10-CM

## 2016-10-12 LAB — URINALYSIS, ROUTINE W REFLEX MICROSCOPIC
BILIRUBIN URINE: NEGATIVE
Glucose, UA: NEGATIVE mg/dL
KETONES UR: NEGATIVE mg/dL
Nitrite: POSITIVE — AB
PH: 5 (ref 5.0–8.0)
PROTEIN: 30 mg/dL — AB
SQUAMOUS EPITHELIAL / LPF: NONE SEEN
Specific Gravity, Urine: 1.012 (ref 1.005–1.030)

## 2016-10-12 MED ORDER — LIDOCAINE HCL 1 % IJ SOLN
INTRAMUSCULAR | Status: AC
  Administered 2016-10-12: 01:00:00
  Filled 2016-10-12: qty 20

## 2016-10-12 MED ORDER — CEPHALEXIN 500 MG PO CAPS: 500.0000 mg | ORAL_CAPSULE | Freq: Two times a day (BID) | ORAL | 0 refills | Status: DC

## 2016-10-12 MED ORDER — CEFTRIAXONE SODIUM 1 G IJ SOLR
1.0000 g | Freq: Once | INTRAMUSCULAR | Status: AC
  Administered 2016-10-12: 1 g via INTRAMUSCULAR
  Filled 2016-10-12: qty 10

## 2016-10-12 NOTE — ED Provider Notes (Signed)
[facility] DEPT Provider Note   CSN: [id number] Arrival date & time: 10/12/16  3151     History   Chief Complaint Chief Complaint  Patient presents with  . Urinary Retention    HPI Eric Carter is a 83 y.o. male.  Patient c/o urinary retention.  Had 'laser prostate' procedure last week, hematuria intermittently since. Had catheter changed yesterday.  This AM felt as if catheter was blocked, decreased urine out, and suprapubic pain.  No fever or chills. No nausea or vomiting. Symptoms moderate/persistent.    The history is provided by the patient.    Past Medical History:  Diagnosis Date  . BPH (benign prostatic hyperplasia)   . Chronic constipation   . Coronary artery disease cardiologist-  dr Daneen Schick   hx STEMI inferior  in 2007, distla RCA angioplasty  . First degree heart block   . GERD (gastroesophageal reflux disease)   . History of atrial fibrillation without current medication    episode Atrial Fib. during cardiac cath. 02-28-2006 resolved before discharge  . History of ST elevation myocardial infarction (STEMI)    02-28-2006  inferior STEMI  s/p  RCA angioplasty  . History of urinary retention    03/ 2012;  03/ 2018  . Hyperlipidemia   . Hypertension   . OSA on CPAP    per sleetp study 10-08-2015 Moderate with AHI 19/hr now on CPAP  . RBBB (right bundle branch block)   . S/P PTCA (percutaneous transluminal coronary angioplasty) 02/28/2006   distal RCA  . Self-catheterizes urinary bladder   . Type 2 diabetes mellitus ([place])   . Urinary retention 08/2016   self -- cath  . Wears glasses     Patient Active Problem List   Diagnosis Date Noted  . OSA (obstructive sleep apnea) 01/25/2016  . Obesity (BMI 30.0-34.9) 01/25/2016  . Diabetes type 2, controlled ([place]) 07/28/2015  . Medication management 07/28/2015  . Coronary atherosclerosis of native coronary artery 07/09/2013  . Essential hypertension, benign 07/09/2013  . Hyperlipidemia 07/09/2013  .  Reflux esophagitis 07/09/2013    Past Surgical History:  Procedure Laterality Date  . CARDIAC CATHETERIZATION  02-28-2006  dr Daneen Schick   PCI distal RCA /  mLAD  50%,  inferior akinesis, develpment of atrial fibrillation during case,  ef 50%  . CARDIOVASCULAR STRESS TEST  09/20/2002   normal nuclear study w/ no ischemia (diaphragmatic attenuation of the inferior wall)/  normal LV function and wall motion, ef 72%  . CATARACT EXTRACTION W/ INTRAOCULAR LENS  IMPLANT, BILATERAL  2015  . COLONOSCOPY  last one 07-02-2013  . THULIUM LASER TURP (TRANSURETHRAL RESECTION OF PROSTATE) N/A 10/07/2016   Procedure: THULIUM LASER TURP (TRANSURETHRAL RESECTION OF PROSTATE);  Surgeon: Carolan Clines, MD;  Location: [facility];  Service: Urology;  Laterality: N/A;  . TONSILLECTOMY  child       Home Medications    Prior to Admission medications   Medication Sig Start Date End Date Taking? Authorizing Provider  cephALEXin (KEFLEX) 500 MG capsule Take 1 capsule (500 mg total) by mouth 2 (two) times daily. 10/12/16   Orpah Greek, MD  Coenzyme Q10 (COQ-10) 200 MG CAPS Take 1 capsule by mouth every morning.    Historical Provider, MD  glimepiride (AMARYL) 4 MG tablet Take 4 mg by mouth 2 (two) times daily.     Historical Provider, MD  Hyoscyamine Sulfate SL (LEVSIN/SL) 0.125 MG SUBL Place 0.125 mg under the tongue 2 (two) times daily between  meals as needed. Patient taking differently: Place 0.125 mg under the tongue 2 (two) times daily between meals as needed (cramps).  10/07/16   Carolan Clines, MD  losartan (COZAAR) 25 MG tablet TAKE 1 TABLET (25 MG TOTAL) BY MOUTH DAILY. Patient taking differently: Take 25 mg by mouth every morning.  07/29/16   [name], MD  meloxicam (MOBIC) 15 MG tablet Take 1 tablet (15 mg total) by mouth daily. 10/07/16   Carolan Clines, MD  metFORMIN (GLUMETZA) 500 MG (MOD) 24 hr tablet Take 500-1,000 mg by mouth 2 (two) times daily with a  meal. One tablet in the morning, two tablets in the evening    Historical Provider, MD  Omega-3 Fatty Acids (FISH OIL) 1200 MG CAPS Take 1 capsule by mouth 2 (two) times daily.    Historical Provider, MD  omeprazole (PRILOSEC) 20 MG capsule Take 20 mg by mouth every morning.     Historical Provider, MD  phenazopyridine (PYRIDIUM) 200 MG tablet Take 1 tablet (200 mg total) by mouth 3 (three) times daily as needed for pain. For urinary burning Note: will stain clothing 10/07/16   Carolan Clines, MD  pioglitazone (ACTOS) 30 MG tablet Take 30 mg by mouth every morning.     Historical Provider, MD  Probiotic Product (PROBIOTIC DAILY) CAPS Take 1 capsule by mouth 2 (two) times daily.    Historical Provider, MD  simvastatin (ZOCOR) 5 MG tablet Take 5 mg by mouth every other day. Takes in the evening    Historical Provider, MD  tamsulosin (FLOMAX) 0.4 MG CAPS capsule Take 0.4 mg by mouth at bedtime.  08/28/16   Historical Provider, MD    Family History Family History  Problem Relation Age of Onset  . Colon cancer Father   . Stomach cancer Neg Hx     Social History Social History  Substance Use Topics  . Smoking status: Former Smoker    Types: Cigars    Quit date: 10/02/1970  . Smokeless tobacco: Current User    Types: Chew     Comment: per pt occasionally chews tobacco  . Alcohol use No     Allergies   Lisinopril; Codeine; Pravastatin sodium; and Simvastatin   Review of Systems Review of Systems  Constitutional: Negative for chills and fever.  Respiratory: Negative for shortness of breath.   Gastrointestinal: Negative for nausea and vomiting.  Genitourinary: Positive for hematuria.     Physical Exam Updated Vital Signs BP (!) 163/72   Pulse 71   Temp 97.7 F (36.5 C) (Oral)   Resp 15   SpO2 94%   Physical Exam  Constitutional: He appears well-developed and well-nourished. No distress.  HENT:  Mouth/Throat: Oropharynx is clear and moist.  Eyes: No scleral icterus.    Neck: No tracheal deviation present.  Cardiovascular: Normal rate.   Pulmonary/Chest: Effort normal. No accessory muscle usage. No respiratory distress.  Abdominal: Soft. He exhibits no distension. There is no tenderness.  Genitourinary:  Genitourinary Comments: Foley cath in place otherwise normal external exam.   Musculoskeletal: He exhibits no edema.  Neurological: He is alert.  Skin: Skin is warm and dry. He is not diaphoretic.  Psychiatric: He has a normal mood and affect.  Nursing note and vitals reviewed.    ED Treatments / Results  Labs (all labs ordered are listed, but only abnormal results are displayed) Labs Reviewed - No data to display  EKG  EKG Interpretation None       Radiology No results  found.  Procedures Procedures (including critical care time)  Medications Ordered in ED Medications - No data to display   Initial Impression / Assessment and Plan / ED Course  I have reviewed the triage vital signs and the nursing notes.  Pertinent labs & imaging results that were available during my care of the patient were reviewed by me and considered in my medical decision making (see chart for details).  Foley irrigated by nursing.    Now w 1 liter light blood tinged urine in bag, and resolution of symptoms.  Patient appears stable for d/c.  Rec outpt urology f/u.     Final Clinical Impressions(s) / ED Diagnoses   Final diagnoses:  None    New Prescriptions New Prescriptions   No medications on file     Lajean Saver, MD 10/12/16 1034

## 2016-10-12 NOTE — Discharge Instructions (Signed)
It was our pleasure to provide your ER care today - we hope that you feel better.  Drink plenty of fluids.  Empty leg bag as need.  Follow up with your urologist this coming week.  Return to ER if worse, catheter not draining, fevers, severe abdominal pain, other concern.

## 2016-10-12 NOTE — ED Notes (Signed)
Bladder Scan = <900ML

## 2016-10-12 NOTE — ED Notes (Addendum)
At this time #14 removed and #20 coude inserted with out difficulty. Noted to have mucus in opening of removed cath. #20 draining without difficulty. Rechecked bladder with scanner. 0 volume noted.

## 2016-10-12 NOTE — ED Triage Notes (Signed)
He states he had laser TUR last Mon.; was re-catheterized Thurs. D/t urine retention. His foley "clogged up and they put a new one in last night--and today it's not draining".

## 2016-10-12 NOTE — ED Notes (Signed)
Attempted to urinate cath, unable to obtain good urine flow, spoke with Dr Ashok Cordia who agrees cath needs to be changed to larger coude.

## 2016-10-12 NOTE — ED Notes (Signed)
Bed: WA08 Expected date:  Expected time:  Means of arrival:  Comments: 

## 2016-10-15 LAB — URINE CULTURE

## 2016-10-16 ENCOUNTER — Telehealth: Payer: Self-pay | Admitting: Emergency Medicine

## 2016-10-16 ENCOUNTER — Other Ambulatory Visit: Payer: Self-pay | Admitting: Urology

## 2016-10-16 NOTE — Telephone Encounter (Signed)
Post ED Visit - Positive Culture Follow-up  Culture report reviewed by antimicrobial stewardship pharmacist:  []  Elenor Quinones, Pharm.D. []  Heide Guile, Pharm.D., BCPS AQ-ID []  Parks Neptune, Pharm.D., BCPS [x]  Alycia Rossetti, Pharm.D., BCPS []  [place], Pharm.D., BCPS, AAHIVP []  Legrand Como, Pharm.D., BCPS, AAHIVP []  Salome Arnt, PharmD, BCPS []  Dimitri Ped, PharmD, BCPS []  Vincenza Hews, PharmD, BCPS  Positive urine culture Treated with cephalexin, organism sensitive to the same and no further patient follow-up is required at this time.  Hazle Nordmann 10/16/2016, 12:14 PM

## 2016-10-29 NOTE — Progress Notes (Signed)
LOV cardio Daneen Schick 08-09-16 epic EKG 08-09-16 epic UA 4-47-18 epic

## 2016-10-29 NOTE — Patient Instructions (Addendum)
ALISON BREEDING  10/29/2016   Your procedure is scheduled on: 11-07-16  Report to [facility] Main  Entrance Take [place]  elevators to 3rd floor to  [place] at 530AM.   Call this number if you have problems the morning of surgery [phone]    Remember: ONLY 1 PERSON MAY GO WITH YOU TO SHORT STAY TO GET  READY MORNING OF YOUR SURGERY.  Do not eat food or drink liquids :After Midnight.     Take these medicines the morning of surgery with A SIP OF WATER: omeprazole(prolosec)   BRING CPAP MASK AND TUBING. MACHINE WILL BE PROVIDED!!  DO NOT TAKE ANY DIABETIC MEDICATIONS DAY OF YOUR SURGERY                               You may not have any metal on your body including hair pins and              piercings  Do not wear jewelry, make-up, lotions, powders or perfumes, deodorant                Men may shave face and neck.   Do not bring valuables to the hospital. [place].  Contacts, dentures or bridgework may not be worn into surgery.  Leave suitcase in the car. After surgery it may be brought to your room.               Please read over the following fact sheets you were given: _____________________________________________________________________             How to Manage Your Diabetes Before and After Surgery  Why is it important to control my blood sugar before and after surgery? . Improving blood sugar levels before and after surgery helps healing and can limit problems. . A way of improving blood sugar control is eating a healthy diet by: o  Eating less sugar and carbohydrates o  Increasing activity/exercise o  Talking with your doctor about reaching your blood sugar goals . High blood sugars (greater than 180 mg/dL) can raise your risk of infections and slow your recovery, so you will need to focus on controlling your diabetes during the weeks before surgery. . Make sure that the doctor who  takes care of your diabetes knows about your planned surgery including the date and location.  How do I manage my blood sugar before surgery? . Check your blood sugar at least 4 times a day, starting 2 days before surgery, to make sure that the level is not too high or low. o Check your blood sugar the morning of your surgery when you wake up and every 2 hours until you get to the Short Stay unit. . If your blood sugar is less than 70 mg/dL, you will need to treat for low blood sugar: o Do not take insulin. o Treat a low blood sugar (less than 70 mg/dL) with  cup of clear juice (cranberry or apple), 4 glucose tablets, OR glucose gel. o Recheck blood sugar in 15 minutes after treatment (to make sure it is greater than 70 mg/dL). If your blood sugar is not greater than 70 mg/dL on recheck, call [phone] for further instructions. . Report your blood  sugar to the short stay nurse when you get to Short Stay.  . If you are admitted to the hospital after surgery: o Your blood sugar will be checked by the staff and you will probably be given insulin after surgery (instead of oral diabetes medicines) to make sure you have good blood sugar levels. o The goal for blood sugar control after surgery is 80-180 mg/dL.   WHAT DO I DO ABOUT MY DIABETES MEDICATION?  Marland Kitchen Do not take oral diabetes medicines (pills) the morning of surgery.  . THE DAY BEFORE SURGERY 11-06-16,    Only take breakfast and lunch dose of GLIMEPIRIDE. Do not take any dinner/night time dose   Take METFORMIN as usual    Take pioglitazone(ACTOS) as usual       . THE MORNING OF SURGERY 11-07-16, DO NOT TAKE NAY DIABETES MEDICATION      Patient Signature:  Date:   Nurse Signature:  Date:   Reviewed and Endorsed by [place] Patient Education Committee, August 2015  [facility] - Preparing for Surgery Before surgery, you can play an important role.  Because skin is not sterile, your skin needs to be as free of germs as  possible.  You can reduce the number of germs on your skin by washing with CHG (chlorahexidine gluconate) soap before surgery.  CHG is an antiseptic cleaner which kills germs and bonds with the skin to continue killing germs even after washing. Please DO NOT use if you have an allergy to CHG or antibacterial soaps.  If your skin becomes reddened/irritated stop using the CHG and inform your nurse when you arrive at Short Stay. Do not shave (including legs and underarms) for at least 48 hours prior to the first CHG shower.  You may shave your face/neck. Please follow these instructions carefully:  1.  Shower with CHG Soap the night before surgery and the  morning of Surgery.  2.  If you choose to wash your hair, wash your hair first as usual with your  normal  shampoo.  3.  After you shampoo, rinse your hair and body thoroughly to remove the  shampoo.                           4.  Use CHG as you would any other liquid soap.  You can apply chg directly  to the skin and wash                       Gently with a scrungie or clean washcloth.  5.  Apply the CHG Soap to your body ONLY FROM THE NECK DOWN.   Do not use on face/ open                           Wound or open sores. Avoid contact with eyes, ears mouth and genitals (private parts).                       Wash face,  Genitals (private parts) with your normal soap.             6.  Wash thoroughly, paying special attention to the area where your surgery  will be performed.  7.  Thoroughly rinse your body with warm water from the neck down.  8.  DO NOT shower/wash with your normal soap after using and rinsing off  the CHG Soap.                9.  Pat yourself dry with a clean towel.            10.  Wear clean pajamas.            11.  Place clean sheets on your bed the night of your first shower and do not  sleep with pets. Day of Surgery : Do not apply any lotions/deodorants the morning of surgery.  Please wear clean clothes to the hospital/surgery  center.  FAILURE TO FOLLOW THESE INSTRUCTIONS MAY RESULT IN THE CANCELLATION OF YOUR SURGERY PATIENT SIGNATURE_________________________________  NURSE SIGNATURE__________________________________  ________________________________________________________________________

## 2016-10-30 ENCOUNTER — Encounter (HOSPITAL_COMMUNITY): Payer: Self-pay

## 2016-10-30 ENCOUNTER — Encounter (HOSPITAL_COMMUNITY)
Admission: RE | Admit: 2016-10-30 | Discharge: 2016-10-30 | Disposition: A | Discharge status: Home / Self Care | Payer: Medicare Other | Source: Ambulatory Visit | Attending: Urology | Admitting: Urology

## 2016-10-30 DIAGNOSIS — K219 Gastro-esophageal reflux disease without esophagitis: Secondary | ICD-10-CM | POA: Insufficient documentation

## 2016-10-30 DIAGNOSIS — G4733 Obstructive sleep apnea (adult) (pediatric): Secondary | ICD-10-CM | POA: Insufficient documentation

## 2016-10-30 DIAGNOSIS — E119 Type 2 diabetes mellitus without complications: Secondary | ICD-10-CM | POA: Insufficient documentation

## 2016-10-30 DIAGNOSIS — R339 Retention of urine, unspecified: Secondary | ICD-10-CM | POA: Diagnosis not present

## 2016-10-30 DIAGNOSIS — Z01812 Encounter for preprocedural laboratory examination: Secondary | ICD-10-CM | POA: Insufficient documentation

## 2016-10-30 DIAGNOSIS — E785 Hyperlipidemia, unspecified: Secondary | ICD-10-CM | POA: Insufficient documentation

## 2016-10-30 DIAGNOSIS — E669 Obesity, unspecified: Secondary | ICD-10-CM | POA: Insufficient documentation

## 2016-10-30 LAB — CBC
HCT: 39.4 % (ref 39.0–52.0)
Hemoglobin: 13.1 g/dL (ref 13.0–17.0)
MCH: 31.6 pg (ref 26.0–34.0)
MCHC: 33.2 g/dL (ref 30.0–36.0)
MCV: 94.9 fL (ref 78.0–100.0)
PLATELETS: 115 10*3/uL — AB (ref 150–400)
RBC: 4.15 MIL/uL — AB (ref 4.22–5.81)
RDW: 14 % (ref 11.5–15.5)
WBC: 4.8 10*3/uL (ref 4.0–10.5)

## 2016-10-30 LAB — BASIC METABOLIC PANEL
Anion gap: 7 (ref 5–15)
BUN: 20 mg/dL (ref 6–20)
CALCIUM: 9.3 mg/dL (ref 8.9–10.3)
CO2: 28 mmol/L (ref 22–32)
CREATININE: 1.15 mg/dL (ref 0.61–1.24)
Chloride: 108 mmol/L (ref 101–111)
GFR calc non Af Amer: 57 mL/min — ABNORMAL LOW (ref >60)
Glucose, Bld: 95 mg/dL (ref 65–99)
Potassium: 4.4 mmol/L (ref 3.5–5.1)
SODIUM: 143 mmol/L (ref 135–145)

## 2016-10-30 LAB — GLUCOSE, CAPILLARY: Glucose-Capillary: 110 mg/dL — ABNORMAL HIGH (ref 65–99)

## 2016-10-30 NOTE — Progress Notes (Signed)
CBC results routed via epic to Dr Gaynelle Arabian

## 2016-10-31 LAB — HEMOGLOBIN A1C
Hgb A1c MFr Bld: 7.1 % — ABNORMAL HIGH (ref 4.8–5.6)
MEAN PLASMA GLUCOSE: 157 mg/dL

## 2016-11-01 DIAGNOSIS — K5900 Constipation, unspecified: Secondary | ICD-10-CM | POA: Diagnosis not present

## 2016-11-01 DIAGNOSIS — M6281 Muscle weakness (generalized): Secondary | ICD-10-CM | POA: Diagnosis not present

## 2016-11-01 DIAGNOSIS — R338 Other retention of urine: Secondary | ICD-10-CM | POA: Diagnosis not present

## 2016-11-01 DIAGNOSIS — M62838 Other muscle spasm: Secondary | ICD-10-CM | POA: Diagnosis not present

## 2016-11-06 NOTE — H&P (Signed)
Office Visit Report     10/10/2016   --------------------------------------------------------------------------------   Eric Carter  MRN: [id number]  PRIMARY CARE:  Maura L. Hamrick, MD  DOB: Jul 05, 1931, 83 year old Male  REFERRING:  Inayah Woodin I. Gaynelle Arabian, MD    PROVIDER:  Carolan Clines, M.D.    TREATING:  Jiles Crocker    LOCATION:  [facility] [phone]   --------------------------------------------------------------------------------   CC: I have an enlarged prostate (S/P Surgery).  HPI: Eric Carter is a 83 year-old male established patient who is here for an enlarged prostate after surgical intervention.  Procedure was only partially completed due to gross bleeding from the prostate itself. This was understanding going into the procedure that likely only half of the prostate could be lased due to its size as pt wanted to avoid the recommended open or robotic prostatectomy. Pt remained hemodynamically stable. Presents today for evaluation and voiding trial. He is doing well and afebrile.   He has had a laser turp for treatment of his lower urinary tract symptoms due to his BPH. It was performed 10/07/2016.     ALLERGIES: Codeine Derivatives - Personality Altered lisinopril - cough pravastatin - myalgia    MEDICATIONS: Bethanechol Chloride 25 mg tablet TAKE 3 TABLETS IN THE MORNING AND 2 TABLETS AT BEDTIME  Simvastatin 5 mg tablet  Tamsulosin Hcl 0.4 mg capsule, ext release 24 hr 1 capsule PO Q HS  16Fr Magic I&O Catheter Use as directed  Aspirin Ec 81 mg tablet, delayed release Oral  Bethanechol Chloride 25 MG Oral Tablet 3 tablets q AM and 2 tablets q hs  Fish Oil  Glimepiride 4 mg tablet Oral  Losartan Potassium 25 mg tablet Oral  Metformin Hcl Er 1,000 mg tablet, extended release 24 hr Oral  Nitrostat 0.4 mg tablet, sublingual Sublingual  Omeprazole 20 mg capsule,delayed release Oral  Testosterone Cypionate 200 MG/ML Intramuscular Solution 1  ml IM Q2WK  Zetia 10 MG Oral Tablet Oral     GU PSH: Catheterize For Residual - 09/24/2016 Complex cystometrogram, w/ void pressure and urethral pressure profile studies, any technique - 09/16/2016 Complex Uroflow - 09/16/2016 Cystoscopy - 09/24/2016 Emg surf Electrd - 09/16/2016 Inject For cystogram - 09/16/2016 Intrabd voidng Press - 09/16/2016 Laser Surgery Prostate - 10/07/2016      PSH Notes: Cataract Surgery, Tonsillectomy, Cath Laser Angioplasty   NON-GU PSH: Bmi Not Calculated - 09/25/2016 Doc Meds Verified W/Pt Or Re - 09/25/2016 Other Pt/Ot Current Status - 09/25/2016 Other Pt/Ot Goal Status - 09/25/2016 Pain Doc Pos And Plan - 09/25/2016 Remove Tonsils - 2009    GU PMH: Areflexic bladder - 09/24/2016, - 03/06/2016, Hypotonic bladder, - 08/29/2015 Urinary Retention, Unspec - 09/24/2016 Urinary Retention - 08/28/2016 BPH w/LUTS - 05/03/2016, - 03/06/2016, Benign prostatic hyperplasia with urinary obstruction, - 08/29/2015 Elevated PSA - 05/03/2016, Elevated prostate specific antigen (PSA), - 08/29/2015 Primary hypogonadism - 05/03/2016, - 03/06/2016, Hypogonadism, testicular, - 08/29/2015, Secondary male hypogonadism, - 2015 ED due to arterial insufficiency - 03/06/2016, Erectile dysfunction due to arterial insufficiency, - 2016 Encounter for Prostate Cancer screening, Visit For: Screening Exam Malignant Neoplasm Prostate - 2014 Inflammatory Disease Prostate, Unspec, Prostatitis - 2014      PMH Notes:  2007-07-01 11:01:46 - Note: Acute Myocardial Infarction  2007-07-01 11:01:46 - Note: Skin Cancer   NON-GU PMH: Muscle weakness (generalized) - 10/02/2016, - 09/25/2016 Other muscle spasm - 10/02/2016, - 09/25/2016 Constipation, unspecified - 09/25/2016 Encounter for general adult medical examination without abnormal findings, Encounter for  preventive health examination - 08/29/2015 Polyneuropathy, unspecified, Peripheral neuropathy - 08/29/2015 Personal history of other diseases of the digestive  system, History of esophageal reflux - 2014 Personal history of other endocrine, nutritional and metabolic disease, History of hypercholesterolemia - 2014, History of diabetes mellitus, - 2014 Personal history of other specified conditions, History of heartburn - 2014    FAMILY HISTORY: Colon Cancer - Father Family Health Status Number - Runs In Family Father Deceased At Age3 ___ - Runs In Family Mother Deceased At Age 29 from diabetic complicati - Runs In Family   SOCIAL HISTORY: Marital Status: Married Current Smoking Status: Patient has never smoked.  Drinks 1 drink per month. Types of alcohol consumed: Beer.  Drinks 2 caffeinated drinks per day.     Notes: Previous History Of Smoking, Caffeine Use, Marital History - Currently Married, Occupation:, Alcohol Use   REVIEW OF SYSTEMS:    GU Review Male:   Patient reports hard to postpone urination and leakage of urine. Patient denies frequent urination, burning/ pain with urination, get up at night to urinate, stream starts and stops, trouble starting your stream, have to strain to urinate , erection problems, and penile pain.  Gastrointestinal (Upper):   Patient denies nausea, vomiting, and indigestion/ heartburn.  Gastrointestinal (Lower):   Patient denies diarrhea and constipation.  Constitutional:   Patient denies fever, night sweats, weight loss, and fatigue.  Skin:   Patient denies skin rash/ lesion and itching.  Eyes:   Patient denies blurred vision and double vision.  Ears/ Nose/ Throat:   Patient denies sore throat and sinus problems.  Hematologic/Lymphatic:   Patient denies swollen glands and easy bruising.  Cardiovascular:   Patient denies leg swelling and chest pains.  Respiratory:   Patient denies cough and shortness of breath.  Endocrine:   Patient denies excessive thirst.  Musculoskeletal:   Patient denies back pain and joint pain.  Neurological:   Patient denies dizziness and headaches.  Psychologic:   Patient denies  depression and anxiety.   VITAL SIGNS:      10/10/2016 09:17 AM  BP 115/63 mmHg  Heart Rate 58 /min  Temperature 97.3 F / 36 C   GU PHYSICAL EXAMINATION:    Penis: Penile foley catheter present. Circumcised, no foreskin warts, no cracks. No dorsal peyronie's plaques, no left corporal peyronie's plaques, no right corporal peyronie's plaques, no scarring, no shaft warts. No balanitis, no meatal stenosis.    MULTI-SYSTEM PHYSICAL EXAMINATION:    Constitutional: Well-nourished. No physical deformities. Normally developed. Good grooming.     PAST DATA REVIEWED:  Source Of History:  Patient, Family/Caregiver  Records Review:   Previous Hospital Records, Previous Patient Records   05/03/16 06/29/14 02/02/14 01/19/13 03/17/12 08/13/11 08/22/09 09/08/08  PSA  Total PSA 3.71 ng/dl 3.99  2.55  2.28  2.42  6.68  2.98  2.15   Free PSA      1.21     % Free PSA      18       02/29/16 08/22/15 01/18/15 06/29/14 03/19/14 03/09/14 02/02/14 01/19/13  Hormones  Testosterone, Total 542.0 pg/dL 912  627  749  159  683  176  128     PROCEDURES: None   ASSESSMENT:      ICD-10 Details  1 GU:   BPH w/LUTS - N40.1    PLAN:           Schedule Return Visit/Planned Activity: Keep Scheduled Appointment - Office Visit, Follow up MD  Procedure: 10/10/2016  at [facility] [phone] - Voiding Trial (Bladder Lavage Irrigation) - [address]          Document Letter(s):  Created for Patient: Clinical Summary         Notes:   He may continue meloxicam and Flomax. Pyridium and hyoscyamine as needed. Explicit instructions given in regards to return to clinic or emergency department follow-up especially since patient had several bladder spasms today and affectively canceling out any bladder filling. Catheter was removed. Patient understands how to perform CIC. I would prefer if all possible that he not but if it is in the middle of the night and he has not been able to void after 6-8 hours, I'm  okay with him doing this instead of an emergency department visit. I discussed activity limitations, hydration and voiding strategies, bowel management strategies, and appropriate follow-up instructions for her painful inability to void/fever/uncontrollable pain. He will keep f/u with [facility] for pelvic floor rehab and Dr Gaynelle Arabian next month for continued evaluation and monitoring.    Signed by Jiles Crocker on 10/10/16 at 10:51 AM (EDT)     The information contained in this medical record document is considered private and confidential patient information. This information can only be used for the medical diagnosis and/or medical services that are being provided by the patient's selected caregivers. This information can only be distributed outside of the patient's care if the patient agrees and signs waivers of authorization for this information to be sent to an outside source or route.

## 2016-11-07 ENCOUNTER — Encounter (HOSPITAL_COMMUNITY): Payer: Self-pay

## 2016-11-07 ENCOUNTER — Ambulatory Visit (HOSPITAL_COMMUNITY): Payer: Medicare Other | Admitting: Anesthesiology

## 2016-11-07 ENCOUNTER — Encounter (HOSPITAL_COMMUNITY)
Admission: RE | Disposition: A | Discharge status: Home / Self Care | Payer: Self-pay | Source: Ambulatory Visit | Attending: Urology

## 2016-11-07 ENCOUNTER — Observation Stay (HOSPITAL_COMMUNITY)
Admission: RE | Admit: 2016-11-07 | Discharge: 2016-11-08 | Disposition: A | Discharge status: Home / Self Care | Payer: Medicare Other | Source: Ambulatory Visit | Attending: Urology | Admitting: Urology

## 2016-11-07 DIAGNOSIS — I2510 Atherosclerotic heart disease of native coronary artery without angina pectoris: Secondary | ICD-10-CM | POA: Diagnosis not present

## 2016-11-07 DIAGNOSIS — Z885 Allergy status to narcotic agent status: Secondary | ICD-10-CM | POA: Diagnosis not present

## 2016-11-07 DIAGNOSIS — R338 Other retention of urine: Secondary | ICD-10-CM | POA: Diagnosis not present

## 2016-11-07 DIAGNOSIS — Z79899 Other long term (current) drug therapy: Secondary | ICD-10-CM | POA: Diagnosis not present

## 2016-11-07 DIAGNOSIS — Z833 Family history of diabetes mellitus: Secondary | ICD-10-CM | POA: Insufficient documentation

## 2016-11-07 DIAGNOSIS — N401 Enlarged prostate with lower urinary tract symptoms: Secondary | ICD-10-CM | POA: Diagnosis not present

## 2016-11-07 DIAGNOSIS — Z888 Allergy status to other drugs, medicaments and biological substances status: Secondary | ICD-10-CM | POA: Insufficient documentation

## 2016-11-07 DIAGNOSIS — Z9861 Coronary angioplasty status: Secondary | ICD-10-CM | POA: Insufficient documentation

## 2016-11-07 DIAGNOSIS — Z7982 Long term (current) use of aspirin: Secondary | ICD-10-CM | POA: Insufficient documentation

## 2016-11-07 DIAGNOSIS — R339 Retention of urine, unspecified: Secondary | ICD-10-CM | POA: Diagnosis not present

## 2016-11-07 DIAGNOSIS — E785 Hyperlipidemia, unspecified: Secondary | ICD-10-CM | POA: Diagnosis not present

## 2016-11-07 HISTORY — PX: TRANSURETHRAL RESECTION OF PROSTATE: SHX73

## 2016-11-07 LAB — GLUCOSE, CAPILLARY
GLUCOSE-CAPILLARY: 123 mg/dL — AB (ref 65–99)
Glucose-Capillary: 126 mg/dL — ABNORMAL HIGH (ref 65–99)

## 2016-11-07 SURGERY — TURP (TRANSURETHRAL RESECTION OF PROSTATE)
Anesthesia: General | Site: Urethra

## 2016-11-07 MED ORDER — RISAQUAD PO CAPS
1.0000 | ORAL_CAPSULE | Freq: Two times a day (BID) | ORAL | Status: DC
  Administered 2016-11-07 – 2016-11-08 (×3): 1 via ORAL
  Filled 2016-11-07 (×3): qty 1

## 2016-11-07 MED ORDER — METFORMIN HCL ER 500 MG PO TB24: 500.0000 mg | ORAL_TABLET | Freq: Two times a day (BID) | ORAL | Status: DC

## 2016-11-07 MED ORDER — DIPHENHYDRAMINE HCL 50 MG/ML IJ SOLN: 12.5000 mg | Freq: Four times a day (QID) | INTRAMUSCULAR | Status: DC | PRN

## 2016-11-07 MED ORDER — METFORMIN HCL ER 500 MG PO TB24
500.0000 mg | ORAL_TABLET | Freq: Every day | ORAL | Status: DC
  Administered 2016-11-08: 500 mg via ORAL
  Filled 2016-11-07: qty 1

## 2016-11-07 MED ORDER — KETOROLAC TROMETHAMINE 30 MG/ML IJ SOLN
INTRAMUSCULAR | Status: AC
  Filled 2016-11-07: qty 1

## 2016-11-07 MED ORDER — DIPHENHYDRAMINE HCL 12.5 MG/5ML PO ELIX: 12.5000 mg | ORAL_SOLUTION | Freq: Four times a day (QID) | ORAL | Status: DC | PRN

## 2016-11-07 MED ORDER — STERILE WATER FOR IRRIGATION IR SOLN
Status: DC | PRN
  Administered 2016-11-07: 30 mL

## 2016-11-07 MED ORDER — PIOGLITAZONE HCL 30 MG PO TABS
30.0000 mg | ORAL_TABLET | Freq: Every morning | ORAL | Status: DC
  Administered 2016-11-07 – 2016-11-08 (×2): 30 mg via ORAL
  Filled 2016-11-07 (×2): qty 1

## 2016-11-07 MED ORDER — SODIUM CHLORIDE 0.45 % IV SOLN
INTRAVENOUS | Status: DC
  Administered 2016-11-07: 12:00:00 via INTRAVENOUS

## 2016-11-07 MED ORDER — SODIUM CHLORIDE 0.9 % IR SOLN
Status: DC | PRN
  Administered 2016-11-07 (×8): 6000 mL via INTRAVESICAL

## 2016-11-07 MED ORDER — PANTOPRAZOLE SODIUM 40 MG PO TBEC
40.0000 mg | DELAYED_RELEASE_TABLET | Freq: Every day | ORAL | Status: DC
  Administered 2016-11-08: 40 mg via ORAL
  Filled 2016-11-07: qty 1

## 2016-11-07 MED ORDER — EPHEDRINE 5 MG/ML INJ
INTRAVENOUS | Status: AC
  Filled 2016-11-07: qty 10

## 2016-11-07 MED ORDER — FENTANYL CITRATE (PF) 100 MCG/2ML IJ SOLN
INTRAMUSCULAR | Status: AC
Start: 2016-11-07 — End: 2016-11-07
  Filled 2016-11-07: qty 2

## 2016-11-07 MED ORDER — HYOSCYAMINE SULFATE 0.125 MG SL SUBL
0.1250 mg | SUBLINGUAL_TABLET | Freq: Two times a day (BID) | SUBLINGUAL | Status: DC | PRN
  Filled 2016-11-07: qty 1

## 2016-11-07 MED ORDER — MELOXICAM 15 MG PO TABS
15.0000 mg | ORAL_TABLET | Freq: Every day | ORAL | Status: DC
  Administered 2016-11-08: 15 mg via ORAL
  Filled 2016-11-07: qty 1

## 2016-11-07 MED ORDER — PROPOFOL 10 MG/ML IV BOLUS
INTRAVENOUS | Status: DC | PRN
  Administered 2016-11-07: 150 mg via INTRAVENOUS
  Administered 2016-11-07: 50 mg via INTRAVENOUS

## 2016-11-07 MED ORDER — PROMETHAZINE HCL 25 MG/ML IJ SOLN: 6.2500 mg | INTRAMUSCULAR | Status: DC | PRN

## 2016-11-07 MED ORDER — CEFAZOLIN SODIUM-DEXTROSE 2-4 GM/100ML-% IV SOLN
INTRAVENOUS | Status: AC
  Filled 2016-11-07: qty 100

## 2016-11-07 MED ORDER — ONDANSETRON HCL 4 MG/2ML IJ SOLN: 4.0000 mg | INTRAMUSCULAR | Status: DC | PRN

## 2016-11-07 MED ORDER — LIDOCAINE 2% (20 MG/ML) 5 ML SYRINGE
INTRAMUSCULAR | Status: AC
  Filled 2016-11-07: qty 5

## 2016-11-07 MED ORDER — OXYBUTYNIN CHLORIDE 5 MG PO TABS: 5.0000 mg | ORAL_TABLET | Freq: Three times a day (TID) | ORAL | Status: DC | PRN

## 2016-11-07 MED ORDER — ONDANSETRON HCL 4 MG/2ML IJ SOLN
INTRAMUSCULAR | Status: AC
  Filled 2016-11-07: qty 2

## 2016-11-07 MED ORDER — SULFAMETHOXAZOLE-TRIMETHOPRIM 800-160 MG PO TABS
1.0000 | ORAL_TABLET | Freq: Two times a day (BID) | ORAL | Status: DC
  Administered 2016-11-07 – 2016-11-08 (×3): 1 via ORAL
  Filled 2016-11-07 (×3): qty 1

## 2016-11-07 MED ORDER — BACITRACIN-NEOMYCIN-POLYMYXIN 400-5-5000 EX OINT: 1.0000 "application " | TOPICAL_OINTMENT | Freq: Three times a day (TID) | CUTANEOUS | Status: DC | PRN

## 2016-11-07 MED ORDER — DEXAMETHASONE SODIUM PHOSPHATE 10 MG/ML IJ SOLN
INTRAMUSCULAR | Status: DC | PRN
  Administered 2016-11-07: 10 mg via INTRAVENOUS

## 2016-11-07 MED ORDER — SIMVASTATIN 10 MG PO TABS
5.0000 mg | ORAL_TABLET | ORAL | Status: DC
  Administered 2016-11-07: 5 mg via ORAL
  Filled 2016-11-07: qty 1

## 2016-11-07 MED ORDER — CEFAZOLIN SODIUM-DEXTROSE 2-4 GM/100ML-% IV SOLN
2.0000 g | INTRAVENOUS | Status: AC
  Administered 2016-11-07: 2 g via INTRAVENOUS

## 2016-11-07 MED ORDER — KETOROLAC TROMETHAMINE 30 MG/ML IJ SOLN
INTRAMUSCULAR | Status: DC | PRN
  Administered 2016-11-07: 15 mg via INTRAVENOUS

## 2016-11-07 MED ORDER — LOSARTAN POTASSIUM 25 MG PO TABS
25.0000 mg | ORAL_TABLET | Freq: Every day | ORAL | Status: DC
  Administered 2016-11-08: 25 mg via ORAL
  Filled 2016-11-07: qty 1

## 2016-11-07 MED ORDER — HYDROMORPHONE HCL 1 MG/ML IJ SOLN
INTRAMUSCULAR | Status: AC
  Filled 2016-11-07: qty 0.5

## 2016-11-07 MED ORDER — EPHEDRINE SULFATE 50 MG/ML IJ SOLN
INTRAMUSCULAR | Status: DC | PRN
  Administered 2016-11-07 (×2): 5 mg via INTRAVENOUS

## 2016-11-07 MED ORDER — FENTANYL CITRATE (PF) 100 MCG/2ML IJ SOLN
INTRAMUSCULAR | Status: DC | PRN
  Administered 2016-11-07 (×4): 25 ug via INTRAVENOUS

## 2016-11-07 MED ORDER — HYDROMORPHONE HCL 1 MG/ML IJ SOLN: 0.2500 mg | INTRAMUSCULAR | Status: DC | PRN

## 2016-11-07 MED ORDER — BELLADONNA ALKALOIDS-OPIUM 16.2-60 MG RE SUPP
RECTAL | Status: DC | PRN
  Administered 2016-11-07: 1 via RECTAL

## 2016-11-07 MED ORDER — BELLADONNA ALKALOIDS-OPIUM 16.2-60 MG RE SUPP
RECTAL | Status: AC
  Filled 2016-11-07: qty 1

## 2016-11-07 MED ORDER — ONDANSETRON HCL 4 MG/2ML IJ SOLN
INTRAMUSCULAR | Status: DC | PRN
  Administered 2016-11-07: 4 mg via INTRAVENOUS

## 2016-11-07 MED ORDER — ACETAMINOPHEN 10 MG/ML IV SOLN
1000.0000 mg | Freq: Once | INTRAVENOUS | Status: AC
  Administered 2016-11-07: 1000 mg via INTRAVENOUS

## 2016-11-07 MED ORDER — DEXAMETHASONE SODIUM PHOSPHATE 10 MG/ML IJ SOLN
INTRAMUSCULAR | Status: AC
  Filled 2016-11-07: qty 1

## 2016-11-07 MED ORDER — METFORMIN HCL ER 500 MG PO TB24
1000.0000 mg | ORAL_TABLET | Freq: Every day | ORAL | Status: DC
  Administered 2016-11-07: 1000 mg via ORAL
  Filled 2016-11-07: qty 2

## 2016-11-07 MED ORDER — LIDOCAINE 2% (20 MG/ML) 5 ML SYRINGE
INTRAMUSCULAR | Status: DC | PRN
  Administered 2016-11-07: 100 mg via INTRAVENOUS

## 2016-11-07 MED ORDER — PROPOFOL 10 MG/ML IV BOLUS
INTRAVENOUS | Status: AC
  Filled 2016-11-07: qty 20

## 2016-11-07 MED ORDER — LACTATED RINGERS IV SOLN
INTRAVENOUS | Status: DC | PRN
  Administered 2016-11-07 (×2): via INTRAVENOUS

## 2016-11-07 MED ORDER — SENNOSIDES-DOCUSATE SODIUM 8.6-50 MG PO TABS
2.0000 | ORAL_TABLET | Freq: Every day | ORAL | Status: DC
  Administered 2016-11-07: 2 via ORAL
  Filled 2016-11-07: qty 2

## 2016-11-07 MED ORDER — ACETAMINOPHEN 10 MG/ML IV SOLN
INTRAVENOUS | Status: AC
  Filled 2016-11-07: qty 100

## 2016-11-07 MED ORDER — GLIMEPIRIDE 4 MG PO TABS
4.0000 mg | ORAL_TABLET | Freq: Two times a day (BID) | ORAL | Status: DC
  Administered 2016-11-07 – 2016-11-08 (×2): 4 mg via ORAL
  Filled 2016-11-07 (×2): qty 1

## 2016-11-07 MED ORDER — ZOLPIDEM TARTRATE 5 MG PO TABS: 5.0000 mg | ORAL_TABLET | Freq: Every evening | ORAL | Status: DC | PRN

## 2016-11-07 SURGICAL SUPPLY — 16 items
BAG URINE DRAINAGE (UROLOGICAL SUPPLIES) ×2 IMPLANT
BAG URO CATCHER STRL LF (MISCELLANEOUS) ×3 IMPLANT
CATH HEMA 3WAY 30CC 22FR COUDE (CATHETERS) ×3 IMPLANT
CLOTH BEACON ORANGE TIMEOUT ST (SAFETY) ×3 IMPLANT
COVER SURGICAL LIGHT HANDLE (MISCELLANEOUS) ×3 IMPLANT
GLOVE BIOGEL M STRL SZ7.5 (GLOVE) ×3 IMPLANT
GOWN STRL REUS W/TWL LRG LVL3 (GOWN DISPOSABLE) ×3 IMPLANT
GOWN STRL REUS W/TWL XL LVL3 (GOWN DISPOSABLE) ×3 IMPLANT
HOLDER FOLEY CATH W/STRAP (MISCELLANEOUS) ×2 IMPLANT
LOOP CUT BIPOLAR 24F LRG (ELECTROSURGICAL) ×2 IMPLANT
MANIFOLD NEPTUNE II (INSTRUMENTS) ×3 IMPLANT
PACK CYSTO (CUSTOM PROCEDURE TRAY) ×3 IMPLANT
SYR 30ML LL (SYRINGE) ×2 IMPLANT
SYRINGE IRR TOOMEY STRL 70CC (SYRINGE) ×3 IMPLANT
TUBING CONNECTING 10 (TUBING) ×4 IMPLANT
TUBING CONNECTING 10' (TUBING) ×2

## 2016-11-07 NOTE — Progress Notes (Signed)
Pt refused CPAP for tonight, RT will continue to monitor

## 2016-11-07 NOTE — Anesthesia Postprocedure Evaluation (Signed)
Anesthesia Post Note  Patient: Eric Carter  Procedure(s) Performed: Procedure(s) (LRB): SECOND STAGE TRANSURETHRAL RESECTION OF THE PROSTATE (TURP) (N/A)  Patient location during evaluation: PACU Anesthesia Type: General Level of consciousness: awake and alert Pain management: pain level controlled Vital Signs Assessment: post-procedure vital signs reviewed and stable Respiratory status: spontaneous breathing, nonlabored ventilation, respiratory function stable and patient connected to nasal cannula oxygen Cardiovascular status: blood pressure returned to baseline and stable Postop Assessment: no signs of nausea or vomiting Anesthetic complications: no       Last Vitals:  Vitals:   11/07/16 1015 11/07/16 1030  BP: 118/75 138/81  Pulse: (!) 55 (!) 56  Resp: 12 12  Temp:  36.6 C    Last Pain:  Vitals:   11/07/16 1030  TempSrc:   PainSc: 2                  Keylin Podolsky S

## 2016-11-07 NOTE — Op Note (Signed)
Pre-operative diagnosis :  BPH with chronic urinary retention  Postoperative diagnosis: Same  Operation: Cystoscopy, TURP  Surgeon:  S. Gaynelle Arabian, MD  First assistant:  None  Anesthesia:  General LMA  Preparation: After appropriate preanesthesia, the patient was brought the operating room, placed on the operating table in the dorsal supine position where general LMA anesthesia was introduced. He was replaced in dorsal lithotomy position with the pubis was prepped with Betadine solution and draped in the usual fashion. The history was reviewed. The patient was noted to be status post right lateral lobe vaporization with the thulium laser, with a large amount of bleeding, and a massive prostate. It is elected to stop the procedure, and returned for second stage TURP. The patient now presents for that procedure.  Review history:            Eric Carter is a 83 year-old male established patient who is here for an enlarged prostate after surgical intervention.  Procedure was only partially completed due to gross bleeding from the prostate itself. This was understanding going into the procedure that likely only half of the prostate could be lased due to its size as pt wanted to avoid the recommended open or robotic prostatectomy. Pt remained hemodynamically stable. Presents today for evaluation and voiding trial. He is doing well and afebrile.   He has had a laser turp for treatment of his lower urinary tract symptoms due to his BPH. It was performed 10/07/2016.       Statement of  Likelihood of Success: Excellent. TIME-OUT observed.:  Procedure: Cystourethroscopy revealed that the patient had previous had a procedure with dissection of the right bladder neck and right lateral lobe prostate. There was still a massive amount of tissue remaining. Using the resectoscope loop, resection was accomplished first on the right side of the prostate, from the bladder neck to the vera, extending from  the 11:00 to the 7:00 position, from the 7:00 to the 5:00 position. Chips were evacuated from the bladder and sent the laboratory, and the procedure was caused for electrocoagulation of bleeding. The vera remained intact. Resection was then continued, to resect the left lateral lobe of the prostate, from 1:00 to 6:00, with chips combined and sent to the laboratory. The portion of the prostate at the 1:00 to the 3 composition was noticeably hard, and different than the remaining prostate.  Prosthetic stones were identified, and much prostatic bleeding was noted and electro cauterized. However, it was felt that the patient needed to have both traction and continuous irrigation at the end of procedure, and therefore, a 22 [place] hematuria catheter was placed, with both traction and continuous irrigation. The patient tolerated procedure well, was awakened, and taken to recovery room in good condition. He received a B and O suppository in the beginning of the procedure (patient stated he did well with B and O suppository previously), and received IV Tylenol as well.

## 2016-11-07 NOTE — Anesthesia Preprocedure Evaluation (Signed)
Anesthesia Evaluation  Patient identified by MRN, date of birth, ID band Patient awake    Reviewed: Allergy & Precautions, NPO status , Patient's Chart, lab work & pertinent test results  Airway Mallampati: II  TM Distance: >3 FB Neck ROM: Full    Dental no notable dental hx.    Pulmonary sleep apnea , former smoker,    Pulmonary exam normal breath sounds clear to auscultation       Cardiovascular hypertension, + CAD and + Past MI  Normal cardiovascular exam Rhythm:Regular Rate:Normal     Neuro/Psych negative neurological ROS  negative psych ROS   GI/Hepatic negative GI ROS, Neg liver ROS,   Endo/Other  diabetes  Renal/GU negative Renal ROS  negative genitourinary   Musculoskeletal negative musculoskeletal ROS (+)   Abdominal   Peds negative pediatric ROS (+)  Hematology negative hematology ROS (+)   Anesthesia Other Findings   Reproductive/Obstetrics negative OB ROS                             Anesthesia Physical Anesthesia Plan  ASA: III  Anesthesia Plan: General   Post-op Pain Management:    Induction: Intravenous  Airway Management Planned: LMA  Additional Equipment:   Intra-op Plan:   Post-operative Plan: Extubation in OR  Informed Consent: I have reviewed the patients History and Physical, chart, labs and discussed the procedure including the risks, benefits and alternatives for the proposed anesthesia with the patient or authorized representative who has indicated his/her understanding and acceptance.   Dental advisory given  Plan Discussed with: CRNA and Surgeon  Anesthesia Plan Comments:         Anesthesia Quick Evaluation

## 2016-11-07 NOTE — Anesthesia Procedure Notes (Signed)
Procedure Name: LMA Insertion Date/Time: 11/07/2016 7:40 AM Performed by: Dione Booze Pre-anesthesia Checklist: Emergency Drugs available, Suction available, Patient being monitored and Patient identified Patient Re-evaluated:Patient Re-evaluated prior to inductionOxygen Delivery Method: Circle system utilized Preoxygenation: Pre-oxygenation with 100% oxygen Intubation Type: IV induction LMA: LMA with gastric port inserted LMA Size: 4.0 Number of attempts: 1 Placement Confirmation: positive ETCO2 Tube secured with: Tape Dental Injury: Teeth and Oropharynx as per pre-operative assessment

## 2016-11-07 NOTE — Interval H&P Note (Signed)
History and Physical Interval Note:  11/07/2016 7:06 AM  Eric Carter  has presented today for surgery, with the diagnosis of URINARY RETENTION  The various methods of treatment have been discussed with the patient and family. After consideration of risks, benefits and other options for treatment, the patient has consented to  Procedure(s): TRANSURETHRAL RESECTION OF THE PROSTATE (TURP) (N/A) as a surgical intervention .  The patient's history has been reviewed, patient examined, no change in status, stable for surgery.  Eric have reviewed the patient's chart and labs.  Questions were answered to the patient's satisfaction.     Eric Carter Eric Carter NOTE:  Goals: 2nd resection of prostate following Right sided laser of very large, bloody gland. Pt with continued urinary retention.  Liklihood of Success: Very Good Alternate Rx: Repeat Laser. Has failed medications. Chronic urinary retention.  Disability. 3 day foley. 23 hr hospitalization.

## 2016-11-07 NOTE — Transfer of Care (Signed)
Immediate Anesthesia Transfer of Care Note  Patient: Eric Carter  Procedure(s) Performed: Procedure(s): SECOND STAGE TRANSURETHRAL RESECTION OF THE PROSTATE (TURP) (N/A)  Patient Location: PACU  Anesthesia Type:General  Level of Consciousness: awake and patient cooperative  Airway & Oxygen Therapy: Patient Spontanous Breathing and Patient connected to face mask oxygen  Post-op Assessment: Report given to RN and Post -op Vital signs reviewed and stable  Post vital signs: Reviewed and stable  Last Vitals:  Vitals:   11/07/16 0545  BP: 120/64  Pulse: (!) 57  Resp: 18  Temp: 36.5 C    Last Pain:  Vitals:   11/07/16 0545  TempSrc: Oral  PainSc: 0-No pain         Complications: No apparent anesthesia complications

## 2016-11-08 ENCOUNTER — Encounter (HOSPITAL_COMMUNITY): Payer: Self-pay | Admitting: Urology

## 2016-11-08 DIAGNOSIS — Z885 Allergy status to narcotic agent status: Secondary | ICD-10-CM | POA: Diagnosis not present

## 2016-11-08 DIAGNOSIS — R338 Other retention of urine: Secondary | ICD-10-CM | POA: Diagnosis not present

## 2016-11-08 DIAGNOSIS — Z7982 Long term (current) use of aspirin: Secondary | ICD-10-CM | POA: Diagnosis not present

## 2016-11-08 DIAGNOSIS — Z888 Allergy status to other drugs, medicaments and biological substances status: Secondary | ICD-10-CM | POA: Diagnosis not present

## 2016-11-08 DIAGNOSIS — N401 Enlarged prostate with lower urinary tract symptoms: Secondary | ICD-10-CM | POA: Diagnosis not present

## 2016-11-08 DIAGNOSIS — Z79899 Other long term (current) drug therapy: Secondary | ICD-10-CM | POA: Diagnosis not present

## 2016-11-08 DIAGNOSIS — Z833 Family history of diabetes mellitus: Secondary | ICD-10-CM | POA: Diagnosis not present

## 2016-11-08 DIAGNOSIS — Z9861 Coronary angioplasty status: Secondary | ICD-10-CM | POA: Diagnosis not present

## 2016-11-08 LAB — HEMOGLOBIN AND HEMATOCRIT, BLOOD
HCT: 30.8 % — ABNORMAL LOW (ref 39.0–52.0)
Hemoglobin: 10.4 g/dL — ABNORMAL LOW (ref 13.0–17.0)

## 2016-11-08 MED ORDER — TRIMETHOPRIM 100 MG PO TABS: 100.0000 mg | ORAL_TABLET | ORAL | 0 refills | Status: DC

## 2016-11-08 MED ORDER — MELOXICAM 15 MG PO TABS: 15.0000 mg | ORAL_TABLET | Freq: Every day | ORAL | 0 refills | Status: DC

## 2016-11-08 NOTE — Progress Notes (Signed)
Reviewed Catheter care and maintenance with pt and wife. Supplies given. Eulas Post, RN

## 2016-11-08 NOTE — Progress Notes (Signed)
Urology Progress Note  1 Day Post-Op   Subjective: POD 1 post  2nd stage TURP. Very large, bloody TURP.     No acute urologic events overnight.Urine serosanguinous this AM. Had clot irrigated by hand last night.  Still has traction and CBI.  Ambulation:   negative Flatus:    positive Bowel movement  negative  Pain: some relief  Objective:  Blood pressure (!) 114/55, pulse (!) 55, temperature 98.4 F (36.9 C), temperature source Oral, resp. rate 18, height 6' (1.829 m), weight 101.2 kg (223 lb), SpO2 97 %.  Physical Exam:  General:  No acute distress, awake  Genitourinary:  Negative s-p exam Foley: serosanguinous urine.     I/O last 3 completed shifts: In: 8715 [P.O.:240; I.V.:2175; Other:6300] Out: 15050 [EFUWT:21828; Blood:100]  Recent Labs     11/08/16  0456  HGB  10.4*    No results for input(s): NA, K, CL, CO2, BUN, CREATININE, CALCIUM, GFRNONAA, GFRAA in the last 72 hours.  Invalid input(s): MAGNESIUM   No results for input(s): INR, APTT in the last 72 hours.  Invalid input(s): PT   Invalid input(s): ABG  Assessment/Plan: Hgb with some post op drop to 10, but pt is stable. He wil have d/c of traction and CBI and shower. If he remains stable, he will be dioscharged this AM, and RTC next Tuesday for foley cath d/c. Path pending.

## 2016-11-08 NOTE — Discharge Instructions (Signed)
Post transurethral resection of the prostate (TURP) instructions ° °Your recent prostate surgery requires very special post hospital care. Despite the fact that no skin incisions were used the area around the prostate incision is quite raw and is covered with a scab to promote healing and prevent bleeding. Certain cautions are needed to assure that the scab is not disturbed of the next 2-3 weeks while the healing proceeds. ° °Because the raw surface in your prostate and the irritating effects of urine you may expect frequency of urination and/or urgency (a stronger desire to urinate) and perhaps even getting up at night more often. This will usually resolve or improve slowly over the healing period. You may see some blood in your urine over the first 6 weeks. Do not be alarmed, even if the urine was clear for a while. Get off your feet and drink lots of fluids until clearing occurs. If you start to pass clots or don't improve call us. ° °Diet: ° °You may return to your normal diet immediately. Because of the raw surface of your bladder, alcohol, spicy foods, foods high in acid and drinks with caffeine may cause irritation or frequency and should be used in moderation. To keep your urine flowing freely and avoid constipation, drink plenty of fluids during the day (8-10 glasses). Tip: Avoid cranberry juice because it is very acidic. ° °Activity: ° °Your physical activity doesn't need to be restricted. However, if you are very active, you may see some blood in the urine. We suggest that you reduce your activity under the circumstances until the bleeding has stopped. ° °Bowels: ° °It is important to keep your bowels regular during the postoperative period. Straining with bowel movements can cause bleeding. A bowel movement every other day is reasonable. Use a mild laxative if needed, such as milk of magnesia 2-3 tablespoons, or 2 Dulcolax tablets. Call if you continue to have problems. If you had been taking narcotics  for pain, before, during or after your surgery, you may be constipated. Take a laxative if necessary. ° °Medication: ° °You should resume your pre-surgery medications unless told not to. In addition you may be given an antibiotic to prevent or treat infection. Antibiotics are not always necessary. All medication should be taken as prescribed until the bottles are finished unless you are having an unusual reaction to one of the drugs. ° ° ° ° °Problems you should report to us: ° °a. Fever greater than 101°F. °b. Heavy bleeding, or clots (see notes above about blood in urine). °c. Inability to urinate. °d. Drug reactions (hives, rash, nausea, vomiting, diarrhea). °e. Severe burning or pain with urination that is not improving. ° °

## 2016-11-08 NOTE — Discharge Summary (Signed)
Physician Discharge Summary  Patient ID: KENLY XIAO MRN: [id number] DOB/AGE: 81-Oct-1933 81 y.o.  Admit date: 11/07/2016 Discharge date: 11/08/2016  Admission Diagnoses: URINARY RETENTION  Discharge Diagnoses:  Active Problems:   Urinary retention due to benign prostatic hyperplasia   Discharged [facility] Course: TURP Nov 07, 2016  Significant Diagnostic Studies: Pathology Pending  Discharge Exam: Blood pressure (!) 114/55, pulse (!) 55, temperature 98.4 F (36.9 C), temperature source Oral, resp. rate 18, height 6' (1.829 m), weight 101.2 kg (223 lb), SpO2 97 %.   Disposition: 01-Home or Self Care  Discharge Instructions    Care order/instruction:    Complete by:  As directed    1. D/c CBI this AM 2. D/c traction this AM   Continue foley catheter    Complete by:  As directed    Overnight and leg bag with instructions please   Discontinue IV    Complete by:  As directed      Allergies as of 11/08/2016      Reactions   Lisinopril Cough   Codeine    Wife reports "changes his whole personality"   Pravastatin Sodium    Myalgias   Simvastatin Other (See Comments)   Myalgias      Medication List    STOP taking these medications   aspirin EC 81 MG tablet   bethanechol 25 MG tablet Commonly known as:  URECHOLINE   CoQ-10 200 MG Caps   Fish Oil 1200 MG Caps   tamsulosin 0.4 MG Caps capsule Commonly known as:  FLOMAX     TAKE these medications   glimepiride 4 MG tablet Commonly known as:  AMARYL Take 4 mg by mouth 2 (two) times daily.   Hyoscyamine Sulfate SL 0.125 MG Subl Commonly known as:  LEVSIN/SL Place 0.125 mg under the tongue 2 (two) times daily between meals as needed. What changed:  reasons to take this   losartan 25 MG tablet Commonly known as:  COZAAR TAKE 1 TABLET (25 MG TOTAL) BY MOUTH DAILY. What changed:  when to take this   meloxicam 15 MG tablet Commonly known as:  MOBIC Take 1 tablet (15 mg total) by mouth  daily. What changed:  Another medication with the same name was added. Make sure you understand how and when to take each.   meloxicam 15 MG tablet Commonly known as:  MOBIC Take 1 tablet (15 mg total) by mouth daily. What changed:  You were already taking a medication with the same name, and this prescription was added. Make sure you understand how and when to take each.   metFORMIN 500 MG (MOD) 24 hr tablet Commonly known as:  GLUMETZA Take 500-1,000 mg by mouth 2 (two) times daily with a meal. One tablet in the morning, two tablets in the evening   omeprazole 20 MG capsule Commonly known as:  PRILOSEC Take 20 mg by mouth every morning.   pioglitazone 30 MG tablet Commonly known as:  ACTOS Take 30 mg by mouth every morning.   PROBIOTIC DAILY Caps Take 1 capsule by mouth 2 (two) times daily.   simvastatin 5 MG tablet Commonly known as:  ZOCOR Take 5 mg by mouth every other day. Takes in the evening   trimethoprim 100 MG tablet Commonly known as:  TRIMPEX Take 1 tablet (100 mg total) by mouth 1 day or 1 dose.      Follow-up Information    Schedule an appointment as soon as possible for a visit with  Carolan Clines, MD.   Specialty:  Urology Why:  for  NP f/u for catheter removal for Tuesday and voiding trial Contact information: [place]  [id number] [phone]         1. RTC Tuesday for voiding trial/catheter removal 2. No riding lawnmower; No golf for > 3 weeks.  3. Push fluids  Signed: Jeannie Mallinger I Deyna Carbon 11/08/2016, 8:15 AM

## 2016-11-18 NOTE — Anesthesia Postprocedure Evaluation (Signed)
Anesthesia Post Note  Patient: Eric Carter  Procedure(s) Performed: Procedure(s) (LRB): SECOND STAGE TRANSURETHRAL RESECTION OF THE PROSTATE (TURP) (N/A)     Anesthesia Post Evaluation  Last Vitals:  Vitals:   11/07/16 2209 11/08/16 0517  BP: (!) 110/55 (!) 114/55  Pulse: (!) 56 (!) 55  Resp: 18 18  Temp: 36.6 C 36.9 C    Last Pain:  Vitals:   11/08/16 0517  TempSrc: Oral  PainSc:                  Delilah Mulgrew S

## 2016-11-18 NOTE — Addendum Note (Signed)
Addendum  created 11/18/16 1450 by Myrtie Soman, MD   Sign clinical note

## 2016-11-28 DIAGNOSIS — M6281 Muscle weakness (generalized): Secondary | ICD-10-CM | POA: Diagnosis not present

## 2016-11-28 DIAGNOSIS — K5900 Constipation, unspecified: Secondary | ICD-10-CM | POA: Diagnosis not present

## 2016-11-28 DIAGNOSIS — M62838 Other muscle spasm: Secondary | ICD-10-CM | POA: Diagnosis not present

## 2016-12-03 DIAGNOSIS — N400 Enlarged prostate without lower urinary tract symptoms: Secondary | ICD-10-CM | POA: Diagnosis not present

## 2016-12-10 ENCOUNTER — Telehealth: Payer: Self-pay | Admitting: *Deleted

## 2016-12-10 DIAGNOSIS — K5900 Constipation, unspecified: Secondary | ICD-10-CM | POA: Diagnosis not present

## 2016-12-10 DIAGNOSIS — M6281 Muscle weakness (generalized): Secondary | ICD-10-CM | POA: Diagnosis not present

## 2016-12-10 DIAGNOSIS — M62838 Other muscle spasm: Secondary | ICD-10-CM | POA: Diagnosis not present

## 2016-12-10 MED ORDER — NITROGLYCERIN 0.4 MG SL SUBL
0.4000 mg | SUBLINGUAL_TABLET | SUBLINGUAL | 3 refills | Status: DC | PRN
Start: 2016-12-10 — End: 2018-08-31

## 2016-12-10 NOTE — Telephone Encounter (Signed)
Patient called and requested a refill on nitro from Dr Tamala Julian however this medication is not on patients current or historical med list. He stated that Dr Tamala Julian has not filled this for him for quite some time. Okay to order nitro for the patient? Please advise. Thanks, MI

## 2016-12-10 NOTE — Telephone Encounter (Signed)
Spoke with pt who has a HX of CAD.  He has not renewed his SL NTG in some time and is asking for a refill.  He has not been having any chest pain or new s/s.  Rx will be sent into pharmacy as requested.

## 2016-12-25 DIAGNOSIS — D692 Other nonthrombocytopenic purpura: Secondary | ICD-10-CM | POA: Diagnosis not present

## 2016-12-25 DIAGNOSIS — Z85828 Personal history of other malignant neoplasm of skin: Secondary | ICD-10-CM | POA: Diagnosis not present

## 2016-12-25 DIAGNOSIS — L821 Other seborrheic keratosis: Secondary | ICD-10-CM | POA: Diagnosis not present

## 2016-12-25 DIAGNOSIS — D1801 Hemangioma of skin and subcutaneous tissue: Secondary | ICD-10-CM | POA: Diagnosis not present

## 2016-12-25 DIAGNOSIS — D3613 Benign neoplasm of peripheral nerves and autonomic nervous system of lower limb, including hip: Secondary | ICD-10-CM | POA: Diagnosis not present

## 2016-12-26 DIAGNOSIS — M6281 Muscle weakness (generalized): Secondary | ICD-10-CM | POA: Diagnosis not present

## 2016-12-26 DIAGNOSIS — M62838 Other muscle spasm: Secondary | ICD-10-CM | POA: Diagnosis not present

## 2016-12-26 DIAGNOSIS — K5900 Constipation, unspecified: Secondary | ICD-10-CM | POA: Diagnosis not present

## 2017-01-01 DIAGNOSIS — E119 Type 2 diabetes mellitus without complications: Secondary | ICD-10-CM | POA: Diagnosis not present

## 2017-01-14 DIAGNOSIS — M6281 Muscle weakness (generalized): Secondary | ICD-10-CM | POA: Diagnosis not present

## 2017-01-14 DIAGNOSIS — M62838 Other muscle spasm: Secondary | ICD-10-CM | POA: Diagnosis not present

## 2017-01-15 DIAGNOSIS — N183 Chronic kidney disease, stage 3 (moderate): Secondary | ICD-10-CM | POA: Diagnosis not present

## 2017-01-15 DIAGNOSIS — E1129 Type 2 diabetes mellitus with other diabetic kidney complication: Secondary | ICD-10-CM | POA: Diagnosis not present

## 2017-01-15 DIAGNOSIS — E291 Testicular hypofunction: Secondary | ICD-10-CM | POA: Diagnosis not present

## 2017-01-15 DIAGNOSIS — E782 Mixed hyperlipidemia: Secondary | ICD-10-CM | POA: Diagnosis not present

## 2017-01-15 DIAGNOSIS — D696 Thrombocytopenia, unspecified: Secondary | ICD-10-CM | POA: Diagnosis not present

## 2017-01-16 ENCOUNTER — Encounter: Payer: Self-pay | Admitting: Cardiology

## 2017-01-19 ENCOUNTER — Other Ambulatory Visit: Payer: Self-pay | Admitting: Interventional Cardiology

## 2017-01-21 DIAGNOSIS — E1129 Type 2 diabetes mellitus with other diabetic kidney complication: Secondary | ICD-10-CM | POA: Diagnosis not present

## 2017-01-21 DIAGNOSIS — I2510 Atherosclerotic heart disease of native coronary artery without angina pectoris: Secondary | ICD-10-CM | POA: Diagnosis not present

## 2017-01-21 DIAGNOSIS — K219 Gastro-esophageal reflux disease without esophagitis: Secondary | ICD-10-CM | POA: Diagnosis not present

## 2017-01-21 DIAGNOSIS — D696 Thrombocytopenia, unspecified: Secondary | ICD-10-CM | POA: Diagnosis not present

## 2017-01-21 NOTE — Progress Notes (Signed)
Cardiology Office Note:    Date:  01/22/2017   ID:  Eric Carter, DOB [birth year], MRN [id number]  PCP:  Philmore Pali, NP  Cardiologist:  Fransico Him, MD   Referring MD: Randel Books, FNP   Chief Complaint  Patient presents with  . Sleep Apnea    12 month follow up    History of Present Illness:    Eric Carter is a 83 y.o. male with a hx of of moderate OSA with an AHI of 19.8/hr on PSG and is on CPAP at 12cm H2O.  He is here today for followup and is doing well.  He continues to tolerate his CPAP and feels the pressure is adequate.  He has no problems with his full face mask.  He denies any significant mouth or nasal dryness and no nasal congestion.  He does not think that he is snoring.  He feels rested in the am but still naps some during the day.    Past Medical History:  Diagnosis Date  . BPH (benign prostatic hyperplasia)   . Chronic constipation   . Coronary artery disease cardiologist-  dr Daneen Schick   hx STEMI inferior  in 2007, distla RCA angioplasty  . First degree heart block   . GERD (gastroesophageal reflux disease)   . History of atrial fibrillation without current medication    episode Atrial Fib. during cardiac cath. 02-28-2006 resolved before discharge  . History of ST elevation myocardial infarction (STEMI)    02-28-2006  inferior STEMI  s/p  RCA angioplasty  . History of urinary retention    03/ 2012;  03/ 2018  . Hyperlipidemia   . Hypertension   . OSA on CPAP    per sleetp study 10-08-2015 Moderate with AHI 19/hr now on CPAP  . RBBB (right bundle branch block)   . S/P PTCA (percutaneous transluminal coronary angioplasty) 02/28/2006   distal RCA  . Self-catheterizes urinary bladder   . Type 2 diabetes mellitus ([place])   . Urinary retention 08/2016   self -- cath  . Wears glasses     Past Surgical History:  Procedure Laterality Date  . CARDIAC CATHETERIZATION  02-28-2006  dr Daneen Schick   PCI distal RCA /  mLAD  50%,  inferior akinesis,  develpment of atrial fibrillation during case,  ef 50%  . CARDIOVASCULAR STRESS TEST  09/20/2002   normal nuclear study w/ no ischemia (diaphragmatic attenuation of the inferior wall)/  normal LV function and wall motion, ef 72%  . CATARACT EXTRACTION W/ INTRAOCULAR LENS  IMPLANT, BILATERAL  2015  . COLONOSCOPY  last one 07-02-2013  . THULIUM LASER TURP (TRANSURETHRAL RESECTION OF PROSTATE) N/A 10/07/2016   Procedure: THULIUM LASER TURP (TRANSURETHRAL RESECTION OF PROSTATE);  Surgeon: Carolan Clines, MD;  Location: [facility];  Service: Urology;  Laterality: N/A;  . TONSILLECTOMY  child  . TRANSURETHRAL RESECTION OF PROSTATE N/A 11/07/2016   Procedure: SECOND STAGE TRANSURETHRAL RESECTION OF THE PROSTATE (TURP);  Surgeon: Carolan Clines, MD;  Location: [facility];  Service: Urology;  Laterality: N/A;    Current Medications: Current Meds  Medication Sig  . glimepiride (AMARYL) 4 MG tablet Take 4 mg by mouth 2 (two) times daily.   . hyoscyamine (LEVSIN SL) 0.125 MG SL tablet Place 0.125 mg under the tongue 2 (two) times daily as needed for cramping.  Marland Kitchen losartan (COZAAR) 25 MG tablet TAKE 1 TABLET (25 MG TOTAL) BY MOUTH DAILY.  . meloxicam (MOBIC) 15 MG  tablet Take 1 tablet (15 mg total) by mouth daily.  . metFORMIN (GLUMETZA) 500 MG (MOD) 24 hr tablet Take 500-1,000 mg by mouth 2 (two) times daily with a meal. One tablet in the morning, two tablets in the evening  . metFORMIN (GLUMETZA) 500 MG (MOD) 24 hr tablet Take 500-1,000 mg by mouth 2 (two) times daily. Take 500 mg by mouth in the morning and 1000 mg by mouth in the evening  . nitroGLYCERIN (NITROSTAT) 0.4 MG SL tablet Place 1 tablet (0.4 mg total) under the tongue every 5 (five) minutes as needed for chest pain.  Marland Kitchen omeprazole (PRILOSEC) 20 MG capsule Take 20 mg by mouth every morning.   . pioglitazone (ACTOS) 30 MG tablet Take 30 mg by mouth every morning.   . Probiotic Product (PROBIOTIC DAILY) CAPS Take 1 capsule by  mouth 2 (two) times daily.  . simvastatin (ZOCOR) 5 MG tablet Take 5 mg by mouth every other day. Takes in the evening     Allergies:   Lisinopril; Codeine; Pravastatin sodium; and Simvastatin   Social History   Social History  . Marital status: Married    Spouse name: N/A  . Number of children: N/A  . Years of education: N/A   Social History Main Topics  . Smoking status: Former Smoker    Types: Cigars    Quit date: 10/02/1970  . Smokeless tobacco: Current User    Types: Chew     Comment: per pt occasionally chews tobacco  . Alcohol use Yes     Comment: RARELY   . Drug use: No  . Sexual activity: Not Asked   Other Topics Concern  . None   Social History Narrative  . None     Family History: The patient's family history includes Colon cancer in his father. There is no history of Stomach cancer.  ROS:   Please see the history of present illness.     All other systems reviewed and are negative.  EKGs/Labs/Other Studies Reviewed:    The following studies were reviewed today: CPAP download  EKG:  EKG is not ordered today.    Recent Labs: 10/30/2016: BUN 20; Creatinine, Ser 1.15; Platelets 115; Potassium 4.4; Sodium 143 11/08/2016: Hemoglobin 10.4   Recent Lipid Panel No results found for: CHOL, TRIG, HDL, CHOLHDL, VLDL, LDLCALC, LDLDIRECT  Physical Exam:    VS:  BP 136/64   Pulse 60   Ht 6' (1.829 m)   Wt 227 lb 12.8 oz (103.3 kg)   BMI 30.90 kg/m     Wt Readings from Last 3 Encounters:  01/22/17 227 lb 12.8 oz (103.3 kg)  11/07/16 223 lb (101.2 kg)  10/30/16 223 lb 3.2 oz (101.2 kg)     GEN:  Well nourished, well developed in no acute distress HEENT: Normal NECK: No JVD; No carotid bruits LYMPHATICS: No lymphadenopathy CARDIAC: RRR, no murmurs, rubs, gallops RESPIRATORY:  Clear to auscultation without rales, wheezing or rhonchi  ABDOMEN: Soft, non-tender, non-distended MUSCULOSKELETAL:  No edema; No deformity  SKIN: Warm and dry NEUROLOGIC:   Alert and oriented x 3 PSYCHIATRIC:  Normal affect   ASSESSMENT:    1. OSA (obstructive sleep apnea)   2. Essential hypertension, benign   3. Obesity (BMI 30.0-34.9)    PLAN:    In order of problems listed above:  OSA - the patient is tolerating PAP therapy well without any problems. The PAP download was reviewed today and showed an AHI of 7.1/hr on 12 cm H2O  with 97% compliance in using more than 4 hours nightly.  The patient has been using and benefiting from CPAP use and will continue to benefit from therapy.   His AHI is mildly elevated so I will increase the pressure to 13cm H2O and get a download in 4 weeks. HTN - BP controlled on exam today.  He will continue on Losartan 25mg  daily.  Obesity - I have encouraged him to get into a routine exercise program and cut back on carbs and portions.     Medication Adjustments/Labs and Tests Ordered: Current medicines are reviewed at length with the patient today.  Concerns regarding medicines are outlined above.  No orders of the defined types were placed in this encounter.  No orders of the defined types were placed in this encounter.   Signed, Fransico Him, MD  01/22/2017 8:05 AM    [place]

## 2017-01-22 ENCOUNTER — Encounter (INDEPENDENT_AMBULATORY_CARE_PROVIDER_SITE_OTHER): Payer: Self-pay

## 2017-01-22 ENCOUNTER — Other Ambulatory Visit: Payer: Self-pay

## 2017-01-22 ENCOUNTER — Encounter: Payer: Self-pay | Admitting: Cardiology

## 2017-01-22 ENCOUNTER — Ambulatory Visit (INDEPENDENT_AMBULATORY_CARE_PROVIDER_SITE_OTHER): Payer: Medicare Other | Admitting: Cardiology

## 2017-01-22 VITALS — BP 136/64 | HR 60 | Ht 72.0 in | Wt 227.8 lb

## 2017-01-22 DIAGNOSIS — E669 Obesity, unspecified: Secondary | ICD-10-CM

## 2017-01-22 DIAGNOSIS — I10 Essential (primary) hypertension: Secondary | ICD-10-CM | POA: Diagnosis not present

## 2017-01-22 DIAGNOSIS — G4733 Obstructive sleep apnea (adult) (pediatric): Secondary | ICD-10-CM

## 2017-01-22 NOTE — Patient Instructions (Signed)
Medication Instructions:  Your physician recommends that you continue on your current medications as directed. Please refer to the Current Medication list given to you today.   Labwork: None  Testing/Procedures: None  Follow-Up: Your physician wants you to follow-up in: 1 year with Dr. Radford Pax. You will receive a reminder letter in the mail two months in advance. If you don't receive a letter, please call our office to schedule the follow-up appointment.   Any Other Special Instructions Will Be Listed Below (If Applicable). Your CPAP pressure will be increased to 13 cm H2O.     If you need a refill on your cardiac medications before your next appointment, please call your pharmacy.

## 2017-01-23 ENCOUNTER — Telehealth: Payer: Self-pay | Admitting: *Deleted

## 2017-01-23 NOTE — Telephone Encounter (Signed)
-----   Message from Sueanne Margarita, MD sent at 01/20/2017  5:50 PM EDT ----- AHI slightly elevated - increase CPAP to 13cm H2O and repeat D/L in 2 weeks

## 2017-01-23 NOTE — Telephone Encounter (Signed)
Per DPR Informed patient/ wife of Titration results and patient/wife understanding was verbalized. Patient understands his AHI was slightly elevated. Patient understands Dr Radford Pax has increased his CPAP to 13cm H20 and repeat D/L in 2 weeks.  Patient agrees with treatment [facility] notified of change

## 2017-02-20 ENCOUNTER — Encounter: Payer: Self-pay | Admitting: Cardiology

## 2017-02-25 DIAGNOSIS — M6281 Muscle weakness (generalized): Secondary | ICD-10-CM | POA: Diagnosis not present

## 2017-02-25 DIAGNOSIS — M62838 Other muscle spasm: Secondary | ICD-10-CM | POA: Diagnosis not present

## 2017-02-28 ENCOUNTER — Telehealth: Payer: Self-pay | Admitting: *Deleted

## 2017-02-28 DIAGNOSIS — G4733 Obstructive sleep apnea (adult) (pediatric): Secondary | ICD-10-CM

## 2017-02-28 NOTE — Telephone Encounter (Addendum)
Informed patient of compliance results and verbalized understanding was indicated. Patient states he was on vacation 2 weeks and was not able to use his machine like he should. Then upon returning home he was not able to get water for his water chamber so he has not used his machine for 2 more nights. Patient was able to get some bottled water and will use his machine tonight. Patient understands [facility] will contact him concerning the 2 week auto. Patient was grateful for the call and thanked me.

## 2017-02-28 NOTE — Addendum Note (Signed)
Addended by: Freada Bergeron on: 02/28/2017 05:44 PM   Modules accepted: Orders

## 2017-02-28 NOTE — Telephone Encounter (Signed)
-----   Message from Sueanne Margarita, MD sent at 02/25/2017  1:19 PM EDT ----- AHI still too high - please get a 2 week autotitration from 4 to 20cm H2O

## 2017-03-11 DIAGNOSIS — K5900 Constipation, unspecified: Secondary | ICD-10-CM | POA: Diagnosis not present

## 2017-03-11 DIAGNOSIS — M62838 Other muscle spasm: Secondary | ICD-10-CM | POA: Diagnosis not present

## 2017-03-11 DIAGNOSIS — M6281 Muscle weakness (generalized): Secondary | ICD-10-CM | POA: Diagnosis not present

## 2017-03-24 ENCOUNTER — Encounter: Payer: Self-pay | Admitting: Cardiology

## 2017-03-25 DIAGNOSIS — M62838 Other muscle spasm: Secondary | ICD-10-CM | POA: Diagnosis not present

## 2017-03-25 DIAGNOSIS — M6281 Muscle weakness (generalized): Secondary | ICD-10-CM | POA: Diagnosis not present

## 2017-03-25 DIAGNOSIS — R293 Abnormal posture: Secondary | ICD-10-CM | POA: Diagnosis not present

## 2017-03-27 DIAGNOSIS — R293 Abnormal posture: Secondary | ICD-10-CM | POA: Diagnosis not present

## 2017-03-27 DIAGNOSIS — M6281 Muscle weakness (generalized): Secondary | ICD-10-CM | POA: Diagnosis not present

## 2017-03-27 DIAGNOSIS — M62838 Other muscle spasm: Secondary | ICD-10-CM | POA: Diagnosis not present

## 2017-03-28 NOTE — Telephone Encounter (Signed)
2 week download printed and sent to be scanned

## 2017-04-07 ENCOUNTER — Encounter: Payer: Self-pay | Admitting: Cardiology

## 2017-04-11 DIAGNOSIS — R05 Cough: Secondary | ICD-10-CM | POA: Diagnosis not present

## 2017-04-12 DIAGNOSIS — R05 Cough: Secondary | ICD-10-CM | POA: Diagnosis not present

## 2017-04-15 ENCOUNTER — Telehealth: Payer: Self-pay | Admitting: *Deleted

## 2017-04-15 NOTE — Telephone Encounter (Signed)
-----   Message from Sueanne Margarita, MD sent at 04/09/2017  4:26 PM EDT ----- Good AHI on CPAP but needs to improve compliance

## 2017-04-15 NOTE — Telephone Encounter (Signed)
Informed patient of compliance results and verbalized understanding was indicated. Patient understands his apnea events are in normal range at 5.0. Patient understands he needs to improve his compliance and he explains that he has been sick but will start back using his cpap as soon as he is better. Patient thanked me for the call.

## 2017-04-18 DIAGNOSIS — R5383 Other fatigue: Secondary | ICD-10-CM | POA: Diagnosis not present

## 2017-04-18 DIAGNOSIS — R05 Cough: Secondary | ICD-10-CM | POA: Diagnosis not present

## 2017-04-23 ENCOUNTER — Telehealth: Payer: Self-pay | Admitting: Interventional Cardiology

## 2017-04-23 NOTE — Telephone Encounter (Signed)
Spoke with wife, DPR on file. Wife states pt recently seen by PCP for cough and chest congestion.  Pt was given Zpak and "other stuff" to help with this.  Pt has completed all medications.  Feels better but still having issues with SOB and general fatigue.  Wife states pt can only walk about 173ft and has to rest.  States SOB and fatigue has been going on for months now.  Pt unable to do ADLs the way he use to.  Wife states pt also complains of muscle weakness and pain in legs.  Wife very concerned and really felt like pt needed to come in and be seen by Cards.  Scheduled pt to come in tomorrow to see [employer], PA-C.  Wife verbalized understanding and was appreciative for call.

## 2017-04-23 NOTE — Telephone Encounter (Signed)
New Message  Pt c/o Shortness Of Breath: STAT if SOB developed within the last 24 hours or pt is noticeably SOB on the phone  1. Are you currently SOB (can you hear that pt is SOB on the phone)? No   2. How long have you been experiencing SOB? Per pt for a little while now   3. Are you SOB when sitting or when up moving around? Moving around   4. Are you currently experiencing any other symptoms? Weakness. Per pt states it SOB could be coming from chest congestion. Pt was made an appt with B. Simmons on 11/12.

## 2017-04-24 ENCOUNTER — Encounter (INDEPENDENT_AMBULATORY_CARE_PROVIDER_SITE_OTHER): Payer: Self-pay

## 2017-04-24 ENCOUNTER — Ambulatory Visit: Payer: Medicare Other | Admitting: Physician Assistant

## 2017-04-24 ENCOUNTER — Encounter: Payer: Self-pay | Admitting: Physician Assistant

## 2017-04-24 VITALS — BP 118/66 | HR 62 | Ht 72.0 in | Wt 222.0 lb

## 2017-04-24 DIAGNOSIS — G4733 Obstructive sleep apnea (adult) (pediatric): Secondary | ICD-10-CM

## 2017-04-24 DIAGNOSIS — Z01812 Encounter for preprocedural laboratory examination: Secondary | ICD-10-CM

## 2017-04-24 DIAGNOSIS — E785 Hyperlipidemia, unspecified: Secondary | ICD-10-CM | POA: Diagnosis not present

## 2017-04-24 DIAGNOSIS — I25110 Atherosclerotic heart disease of native coronary artery with unstable angina pectoris: Secondary | ICD-10-CM | POA: Diagnosis not present

## 2017-04-24 DIAGNOSIS — I10 Essential (primary) hypertension: Secondary | ICD-10-CM

## 2017-04-24 MED ORDER — ASPIRIN EC 81 MG PO TBEC: 81.0000 mg | DELAYED_RELEASE_TABLET | Freq: Every day | ORAL | 3 refills | Status: DC

## 2017-04-24 NOTE — Patient Instructions (Signed)
Medication Instructions: Your physician has recommended you make the following change in your medication:  - RESTART Aspirin 81 mg - Take 1 tablet by mouth daily  Labwork: Your physician has recommended that you have lab work today: BMET, CBC,  PT/INR  Procedures/Testing: Your physician has requested that you have a cardiac catheterization. Cardiac catheterization is used to diagnose and/or treat various heart conditions. Doctors may recommend this procedure for a number of different reasons. The most common reason is to evaluate chest pain. Chest pain can be a symptom of coronary artery disease (CAD), and cardiac catheterization can show whether plaque is narrowing or blocking your heart's arteries. This procedure is also used to evaluate the valves, as well as measure the blood flow and oxygen levels in different parts of your heart. For further information please visit [email or web address]. Please follow instruction sheet, as given.  Follow-Up: Your physician recommends that you schedule a follow-up appointment in 2 WEEKS from 04/29/17 with Dr. Tamala Julian or an APP on Dr. Thompson Caul team for post hospital follow up.    Any Additional Special Instructions Will Be Listed Below (If Applicable).    [facility] [address] [place] [address] Dept: [phone] Loc: [phone]  Eric Carter  04/24/2017  You are scheduled for a Cardiac Catheterization on Tuesday, November 13 with Dr. Glenetta Hew.  1. Please arrive at the [facility] (Main Entrance A) at [facility]: [place], [place] [address] at 10:00 AM (two hours before your procedure to ensure your preparation). Free valet parking service is available.   Special note: Every effort is made to have your procedure done on time. Please understand that emergencies sometimes delay scheduled procedures.  2. Diet: Do not eat  or drink anything after midnight prior to your procedure except sips of water to take medications.  3. Labs: Your labs will be performed in the office today  4. Medication instructions in preparation for your procedure:  On the morning of your procedure, take your Aspirin and any morning medicines unless otherwise specified.  You may use sips of water.  DO NOT TAKE Metformin, Actos, or Glimepiride the morning of your procedure  5. Plan for one night stay--bring personal belongings. 6. Bring a current list of your medications and current insurance cards. 7. You MUST have a responsible person to drive you home. 8. Someone MUST be with you the first 24 hours after you arrive home or your discharge will be delayed. 9. Please wear clothes that are easy to get on and off and wear slip-on shoes.  Thank you for allowing [place] to care for you!   -- [facility]    If you need a refill on your cardiac medications before your next appointment, please call your pharmacy.

## 2017-04-24 NOTE — H&P (View-Only) (Signed)
Cardiology Office Note    Date:  04/24/2017   ID:  Eric Carter, DOB 07/26/1931, MRN [id number]  PCP:  Eric Sakai, DO  Cardiologist: Dr. Tamala Julian OSA: Dr. Radford Pax  Chief Complaint: SOB and fatigue  History of Present Illness:   Eric Carter is a 83 y.o. male OSA on CPAP, coronary artery disease with history of acute inferior infarction treated with angioplasty of the distal right coronary in 2007, HTN, HLD and DM presented for SOB and fatigue.   Last seen by Dr. Tamala Julian 07/2016. Continued simvastatin Monday, Wednesday, and Friday. This is followed by primary care. LDL target of 70 or less. He refuses lipid clinic evaluation.   The patient has progressive worsening of dyspnea on exertion for past 6-8 months.  Worsen in  last 1 month.  No improvement despite antibiotic treatment for cold.  He used to go for exercise 3 times per week however unable to do so in the past 2 months due to worsening dyspnea and fatigue. He did not require any nitroglycerin.  Denies orthopnea, palpitation, PND, lower extremity edema or dizziness.  He was taken off aspirin for his prostate cancer surgery in May 2018.  He has not started since then.  Continues to take simvastatin every other day. Patient is going to attained big family gathering over Thanksgiving.    Past Medical History:  Diagnosis Date  . BPH (benign prostatic hyperplasia)   . Chronic constipation   . Coronary artery disease cardiologist-  dr Daneen Schick   hx STEMI inferior  in 2007, distla RCA angioplasty  . First degree heart block   . GERD (gastroesophageal reflux disease)   . History of atrial fibrillation without current medication    episode Atrial Fib. during cardiac cath. 02-28-2006 resolved before discharge  . History of ST elevation myocardial infarction (STEMI)    02-28-2006  inferior STEMI  s/p  RCA angioplasty  . History of urinary retention    03/ 2012;  03/ 2018  . Hyperlipidemia   . Hypertension   . OSA on  CPAP    per sleetp study 10-08-2015 Moderate with AHI 19/hr now on CPAP  . RBBB (right bundle branch block)   . S/P PTCA (percutaneous transluminal coronary angioplasty) 02/28/2006   distal RCA  . Self-catheterizes urinary bladder   . Type 2 diabetes mellitus ([place])   . Urinary retention 08/2016   self -- cath  . Wears glasses     Past Surgical History:  Procedure Laterality Date  . CARDIAC CATHETERIZATION  02-28-2006  dr Daneen Schick   PCI distal RCA /  mLAD  50%,  inferior akinesis, develpment of atrial fibrillation during case,  ef 50%  . CARDIOVASCULAR STRESS TEST  09/20/2002   normal nuclear study w/ no ischemia (diaphragmatic attenuation of the inferior wall)/  normal LV function and wall motion, ef 72%  . CATARACT EXTRACTION W/ INTRAOCULAR LENS  IMPLANT, BILATERAL  2015  . COLONOSCOPY  last one 07-02-2013  . TONSILLECTOMY  child    Current Medications:  Prior to Admission medications   Medication Sig Start Date End Date Taking? Authorizing Provider  glimepiride (AMARYL) 4 MG tablet Take 4 mg by mouth 2 (two) times daily.    Yes [provider]  losartan (COZAAR) 25 MG tablet TAKE 1 TABLET (25 MG TOTAL) BY MOUTH DAILY. 01/20/17  Yes [name], MD  metFORMIN (GLUMETZA) 500 MG (MOD) 24 hr tablet Take 500-1,000 mg by mouth 2 (two) times daily  with a meal. One tablet in the morning, two tablets in the evening   Yes [provider]  nitroGLYCERIN (NITROSTAT) 0.4 MG SL tablet Place 1 tablet (0.4 mg total) under the tongue every 5 (five) minutes as needed for chest pain. 12/10/16 04/24/17 Yes [name], MD  omeprazole (PRILOSEC) 20 MG capsule Take 20 mg by mouth every morning.    Yes [provider]  pioglitazone (ACTOS) 30 MG tablet Take 30 mg by mouth every morning.    Yes [provider]  simvastatin (ZOCOR) 5 MG tablet Take 5 mg by mouth every other day. Takes in the evening   Yes [provider]    Allergies:   Lisinopril;  Codeine; Pravastatin sodium; and Simvastatin   Social History   Socioeconomic History  . Marital status: Married    Spouse name: None  . Number of children: None  . Years of education: None  . Highest education level: None  Social Needs  . Financial resource strain: None  . Food insecurity - worry: None  . Food insecurity - inability: None  . Transportation needs - medical: None  . Transportation needs - non-medical: None  Occupational History  . None  Tobacco Use  . Smoking status: Former Smoker    Types: Cigars    Last attempt to quit: 10/02/1970    Years since quitting: 46.5  . Smokeless tobacco: Current User    Types: Chew  . Tobacco comment: per pt occasionally chews tobacco  Substance and Sexual Activity  . Alcohol use: Yes    Comment: RARELY   . Drug use: No  . Sexual activity: None  Other Topics Concern  . None  Social History Narrative  . None     Family History:  The patient's family history includes Colon cancer in his father.   ROS:   Please see the history of present illness.    ROS All other systems reviewed and are negative.   PHYSICAL EXAM:   VS:  BP 118/66   Pulse 62   Ht 6' (1.829 m)   Wt 222 lb (100.7 kg)   SpO2 95%   BMI 30.11 kg/m    GEN: Well nourished, well developed, in no acute distress  HEENT: normal  Neck: no JVD, carotid bruits, or masses Cardiac: RRR; no murmurs, rubs, or gallops,no edema  Respiratory:  clear to auscultation bilaterally, normal work of breathing GI: soft, nontender, nondistended, + BS MS: no deformity or atrophy  Skin: warm and dry, no rash Neuro:  Alert and Oriented x 3, Strength and sensation are intact Psych: euthymic mood, full affect  Wt Readings from Last 3 Encounters:  04/24/17 222 lb (100.7 kg)  01/22/17 227 lb 12.8 oz (103.3 kg)  11/07/16 223 lb (101.2 kg)      Studies/Labs Reviewed:   EKG:  EKG is ordered today.  The ekg ordered today demonstrates normal sinus rhythm.  No acute  changes. Recent Labs: 10/30/2016: BUN 20; Creatinine, Ser 1.15; Platelets 115; Potassium 4.4; Sodium 143 11/08/2016: Hemoglobin 10.4   Lipid Panel No results found for: CHOL, TRIG, HDL, CHOLHDL, VLDL, LDLCALC, LDLDIRECT  Additional studies/ records that were reviewed today include:   As above    ASSESSMENT & PLAN:   1.  Dyspnea on exertion with fatigue -His symptoms is concerning for angina.  Due to recent decline in functional status discussed invasive and noninvasive evaluation.  He has not take aspirin in past 6 months.  Decided to proceed with  cardiac catheterization for definite evaluation.  The patient understands that risks include but are not limited to stroke (1 in 1000), death (1 in 39), kidney failure [usually temporary] (1 in 500), bleeding (1 in 200), allergic reaction [possibly serious] (1 in 200), and agrees to proceed.   2. coronary artery disease with history of acute inferior infarction treated with angioplasty of the distal right coronary in 2007 -As above   3. HTN -Continue current regimen  4.  HLD  -Continue simvastatin every other day   5. DM  -Per PCP.  6. OSA on CPAP - Followed by Dr. Radford Pax    Medication Adjustments/Labs and Tests Ordered: Current medicines are reviewed at length with the patient today.  Concerns regarding medicines are outlined above.  Medication changes, Labs and Tests ordered today are listed in the Patient Instructions below. Patient Instructions  Medication Instructions: Your physician has recommended you make the following change in your medication:  - RESTART Aspirin 81 mg - Take 1 tablet by mouth daily  Labwork: Your physician has recommended that you have lab work today: BMET, CBC,  PT/INR  Procedures/Testing: Your physician has requested that you have a cardiac catheterization. Cardiac catheterization is used to diagnose and/or treat various heart conditions. Doctors may recommend this procedure for a number of  different reasons. The most common reason is to evaluate chest pain. Chest pain can be a symptom of coronary artery disease (CAD), and cardiac catheterization can show whether plaque is narrowing or blocking your heart's arteries. This procedure is also used to evaluate the valves, as well as measure the blood flow and oxygen levels in different parts of your heart. For further information please visit HugeFiesta.[email or web address]. Please follow instruction sheet, as given.  Follow-Up: Your physician recommends that you schedule a follow-up appointment in 2 WEEKS from 04/29/17 with Dr. Tamala Julian or an APP on Dr. Thompson Caul team for post hospital follow up.    Any Additional Special Instructions Will Be Listed Below (If Applicable).    [place] OFFICE [address] [place] [address] Dept: [phone] Loc: [phone]  JERMOND BURKEMPER  04/24/2017  You are scheduled for a Cardiac Catheterization on Tuesday, November 13 with Dr. Glenetta Hew.  1. Please arrive at the [facility] (Main Entrance A) at [facility]: [place], [place] [address] at 10:00 AM (two hours before your procedure to ensure your preparation). Free valet parking service is available.   Special note: Every effort is made to have your procedure done on time. Please understand that emergencies sometimes delay scheduled procedures.  2. Diet: Do not eat or drink anything after midnight prior to your procedure except sips of water to take medications.  3. Labs: Your labs will be performed in the office today  4. Medication instructions in preparation for your procedure:  On the morning of your procedure, take your Aspirin and any morning medicines unless otherwise specified.  You may use sips of water.  DO NOT TAKE Metformin, Actos, or Glimepiride the morning of your procedure  5. Plan for one night stay--bring personal  belongings. 6. Bring a current list of your medications and current insurance cards. 7. You MUST have a responsible person to drive you home. 8. Someone MUST be with you the first 24 hours after you arrive home or your discharge will be delayed. 9. Please wear clothes that are easy to get on and off and  wear slip-on shoes.  Thank you for allowing [place] to care for you!   -- [facility]    If you need a refill on your cardiac medications before your next appointment, please call your pharmacy.      Jarrett Soho, [place]  04/24/2017 12:22 PM    [facility] [place], [place], [place]  [address] Phone: [phone]; Fax: [phone]

## 2017-04-24 NOTE — Progress Notes (Signed)
Cardiology Office Note    Date:  04/24/2017   ID:  [name], DOB January 12, 1932, MRN [id number]  PCP:  Isaias Sakai, DO  Cardiologist: Dr. Tamala Julian OSA: Dr. Radford Pax  Chief Complaint: SOB and fatigue  History of Present Illness:   Eric Carter is a 83 y.o. male OSA on CPAP, coronary artery disease with history of acute inferior infarction treated with angioplasty of the distal right coronary in 2007, HTN, HLD and DM presented for SOB and fatigue.   Last seen by Dr. Tamala Julian 07/2016. Continued simvastatin Monday, Wednesday, and Friday. This is followed by primary care. LDL target of 70 or less. He refuses lipid clinic evaluation.   The patient has progressive worsening of dyspnea on exertion for past 6-8 months.  Worsen in  last 1 month.  No improvement despite antibiotic treatment for cold.  He used to go for exercise 3 times per week however unable to do so in the past 2 months due to worsening dyspnea and fatigue. He did not require any nitroglycerin.  Denies orthopnea, palpitation, PND, lower extremity edema or dizziness.  He was taken off aspirin for his prostate cancer surgery in May 2018.  He has not started since then.  Continues to take simvastatin every other day. Patient is going to attained big family gathering over Thanksgiving.    Past Medical History:  Diagnosis Date  . BPH (benign prostatic hyperplasia)   . Chronic constipation   . Coronary artery disease cardiologist-  dr Daneen Schick   hx STEMI inferior  in 2007, distla RCA angioplasty  . First degree heart block   . GERD (gastroesophageal reflux disease)   . History of atrial fibrillation without current medication    episode Atrial Fib. during cardiac cath. 02-28-2006 resolved before discharge  . History of ST elevation myocardial infarction (STEMI)    02-28-2006  inferior STEMI  s/p  RCA angioplasty  . History of urinary retention    03/ 2012;  03/ 2018  . Hyperlipidemia   . Hypertension   . OSA on  CPAP    per sleetp study 10-08-2015 Moderate with AHI 19/hr now on CPAP  . RBBB (right bundle branch block)   . S/P PTCA (percutaneous transluminal coronary angioplasty) 02/28/2006   distal RCA  . Self-catheterizes urinary bladder   . Type 2 diabetes mellitus ([place])   . Urinary retention 08/2016   self -- cath  . Wears glasses     Past Surgical History:  Procedure Laterality Date  . CARDIAC CATHETERIZATION  02-28-2006  dr Daneen Schick   PCI distal RCA /  mLAD  50%,  inferior akinesis, develpment of atrial fibrillation during case,  ef 50%  . CARDIOVASCULAR STRESS TEST  09/20/2002   normal nuclear study w/ no ischemia (diaphragmatic attenuation of the inferior wall)/  normal LV function and wall motion, ef 72%  . CATARACT EXTRACTION W/ INTRAOCULAR LENS  IMPLANT, BILATERAL  2015  . COLONOSCOPY  last one 07-02-2013  . TONSILLECTOMY  child    Current Medications:  Prior to Admission medications   Medication Sig Start Date End Date Taking? Authorizing Provider  glimepiride (AMARYL) 4 MG tablet Take 4 mg by mouth 2 (two) times daily.    Yes [provider]  losartan (COZAAR) 25 MG tablet TAKE 1 TABLET (25 MG TOTAL) BY MOUTH DAILY. 01/20/17  Yes [name], MD  metFORMIN (GLUMETZA) 500 MG (MOD) 24 hr tablet Take 500-1,000 mg by mouth 2 (two) times daily  with a meal. One tablet in the morning, two tablets in the evening   Yes [provider]  nitroGLYCERIN (NITROSTAT) 0.4 MG SL tablet Place 1 tablet (0.4 mg total) under the tongue every 5 (five) minutes as needed for chest pain. 12/10/16 04/24/17 Yes [name], MD  omeprazole (PRILOSEC) 20 MG capsule Take 20 mg by mouth every morning.    Yes [provider]  pioglitazone (ACTOS) 30 MG tablet Take 30 mg by mouth every morning.    Yes [provider]  simvastatin (ZOCOR) 5 MG tablet Take 5 mg by mouth every other day. Takes in the evening   Yes [provider]    Allergies:   Lisinopril;  Codeine; Pravastatin sodium; and Simvastatin   Social History   Socioeconomic History  . Marital status: Married    Spouse name: None  . Number of children: None  . Years of education: None  . Highest education level: None  Social Needs  . Financial resource strain: None  . Food insecurity - worry: None  . Food insecurity - inability: None  . Transportation needs - medical: None  . Transportation needs - non-medical: None  Occupational History  . None  Tobacco Use  . Smoking status: Former Smoker    Types: Cigars    Last attempt to quit: 10/02/1970    Years since quitting: 46.5  . Smokeless tobacco: Current User    Types: Chew  . Tobacco comment: per pt occasionally chews tobacco  Substance and Sexual Activity  . Alcohol use: Yes    Comment: RARELY   . Drug use: No  . Sexual activity: None  Other Topics Concern  . None  Social History Narrative  . None     Family History:  The patient's family history includes Colon cancer in his father.   ROS:   Please see the history of present illness.    ROS All other systems reviewed and are negative.   PHYSICAL EXAM:   VS:  BP 118/66   Pulse 62   Ht 6' (1.829 m)   Wt 222 lb (100.7 kg)   SpO2 95%   BMI 30.11 kg/m    GEN: Well nourished, well developed, in no acute distress  HEENT: normal  Neck: no JVD, carotid bruits, or masses Cardiac: RRR; no murmurs, rubs, or gallops,no edema  Respiratory:  clear to auscultation bilaterally, normal work of breathing GI: soft, nontender, nondistended, + BS MS: no deformity or atrophy  Skin: warm and dry, no rash Neuro:  Alert and Oriented x 3, Strength and sensation are intact Psych: euthymic mood, full affect  Wt Readings from Last 3 Encounters:  04/24/17 222 lb (100.7 kg)  01/22/17 227 lb 12.8 oz (103.3 kg)  11/07/16 223 lb (101.2 kg)      Studies/Labs Reviewed:   EKG:  EKG is ordered today.  The ekg ordered today demonstrates normal sinus rhythm.  No acute  changes. Recent Labs: 10/30/2016: BUN 20; Creatinine, Ser 1.15; Platelets 115; Potassium 4.4; Sodium 143 11/08/2016: Hemoglobin 10.4   Lipid Panel No results found for: CHOL, TRIG, HDL, CHOLHDL, VLDL, LDLCALC, LDLDIRECT  Additional studies/ records that were reviewed today include:   As above    ASSESSMENT & PLAN:   1.  Dyspnea on exertion with fatigue -His symptoms is concerning for angina.  Due to recent decline in functional status discussed invasive and noninvasive evaluation.  He has not take aspirin in past 6 months.  Decided to proceed with  cardiac catheterization for definite evaluation.  The patient understands that risks include but are not limited to stroke (1 in 1000), death (1 in 37), kidney failure [usually temporary] (1 in 500), bleeding (1 in 200), allergic reaction [possibly serious] (1 in 200), and agrees to proceed.   2. coronary artery disease with history of acute inferior infarction treated with angioplasty of the distal right coronary in 2007 -As above   3. HTN -Continue current regimen  4.  HLD  -Continue simvastatin every other day   5. DM  -Per PCP.  6. OSA on CPAP - Followed by Dr. Radford Pax    Medication Adjustments/Labs and Tests Ordered: Current medicines are reviewed at length with the patient today.  Concerns regarding medicines are outlined above.  Medication changes, Labs and Tests ordered today are listed in the Patient Instructions below. Patient Instructions  Medication Instructions: Your physician has recommended you make the following change in your medication:  - RESTART Aspirin 81 mg - Take 1 tablet by mouth daily  Labwork: Your physician has recommended that you have lab work today: BMET, CBC,  PT/INR  Procedures/Testing: Your physician has requested that you have a cardiac catheterization. Cardiac catheterization is used to diagnose and/or treat various heart conditions. Doctors may recommend this procedure for a number of  different reasons. The most common reason is to evaluate chest pain. Chest pain can be a symptom of coronary artery disease (CAD), and cardiac catheterization can show whether plaque is narrowing or blocking your heart's arteries. This procedure is also used to evaluate the valves, as well as measure the blood flow and oxygen levels in different parts of your heart. For further information please visit HugeFiesta.[email or web address]. Please follow instruction sheet, as given.  Follow-Up: Your physician recommends that you schedule a follow-up appointment in 2 WEEKS from 04/29/17 with Dr. Tamala Julian or an APP on Dr. Thompson Caul team for post hospital follow up.    Any Additional Special Instructions Will Be Listed Below (If Applicable).    [place] OFFICE [address], [place] [address] Dept: [phone] Loc: [phone]  ALEJANDRO GAMEL  04/24/2017  You are scheduled for a Cardiac Catheterization on Tuesday, November 13 with Dr. Glenetta Hew.  1. Please arrive at the [facility] (Main Entrance A) at [facility]: [place], [place] [address] at 10:00 AM (two hours before your procedure to ensure your preparation). Free valet parking service is available.   Special note: Every effort is made to have your procedure done on time. Please understand that emergencies sometimes delay scheduled procedures.  2. Diet: Do not eat or drink anything after midnight prior to your procedure except sips of water to take medications.  3. Labs: Your labs will be performed in the office today  4. Medication instructions in preparation for your procedure:  On the morning of your procedure, take your Aspirin and any morning medicines unless otherwise specified.  You may use sips of water.  DO NOT TAKE Metformin, Actos, or Glimepiride the morning of your procedure  5. Plan for one night stay--bring personal  belongings. 6. Bring a current list of your medications and current insurance cards. 7. You MUST have a responsible person to drive you home. 8. Someone MUST be with you the first 24 hours after you arrive home or your discharge will be delayed. 9. Please wear clothes that are easy to get on and off and  wear slip-on shoes.  Thank you for allowing [place] to care for you!   -- [facility]    If you need a refill on your cardiac medications before your next appointment, please call your pharmacy.      Jarrett Soho, [place]  04/24/2017 12:22 PM    [facility] [place], [place], [place]  [address] Phone: [phone]; Fax: [phone]

## 2017-04-25 LAB — CBC WITH DIFFERENTIAL/PLATELET
BASOS ABS: 0 10*3/uL (ref 0.0–0.2)
Basos: 1 %
EOS (ABSOLUTE): 0.2 10*3/uL (ref 0.0–0.4)
Eos: 3 %
HEMOGLOBIN: 13 g/dL (ref 13.0–17.7)
Hematocrit: 38.8 % (ref 37.5–51.0)
Immature Grans (Abs): 0.1 10*3/uL (ref 0.0–0.1)
Immature Granulocytes: 1 %
LYMPHS ABS: 1.4 10*3/uL (ref 0.7–3.1)
LYMPHS: 22 %
MCH: 31.1 pg (ref 26.6–33.0)
MCHC: 33.5 g/dL (ref 31.5–35.7)
MCV: 93 fL (ref 79–97)
MONOCYTES: 9 %
Monocytes Absolute: 0.6 10*3/uL (ref 0.1–0.9)
Neutrophils Absolute: 4.3 10*3/uL (ref 1.4–7.0)
Neutrophils: 64 %
PLATELETS: 219 10*3/uL (ref 150–379)
RBC: 4.18 x10E6/uL (ref 4.14–5.80)
RDW: 14.8 % (ref 12.3–15.4)
WBC: 6.5 10*3/uL (ref 3.4–10.8)

## 2017-04-25 LAB — BASIC METABOLIC PANEL
BUN / CREAT RATIO: 15 (ref 10–24)
BUN: 17 mg/dL (ref 8–27)
CALCIUM: 9.7 mg/dL (ref 8.6–10.2)
CHLORIDE: 101 mmol/L (ref 96–106)
CO2: 23 mmol/L (ref 20–29)
Creatinine, Ser: 1.13 mg/dL (ref 0.76–1.27)
GFR calc Af Amer: 70 mL/min/{1.73_m2} (ref >59)
GFR calc non Af Amer: 60 mL/min/{1.73_m2} (ref >59)
GLUCOSE: 100 mg/dL — AB (ref 65–99)
POTASSIUM: 4.7 mmol/L (ref 3.5–5.2)
Sodium: 140 mmol/L (ref 134–144)

## 2017-04-25 LAB — PROTIME-INR
INR: 1 (ref 0.8–1.2)
PROTHROMBIN TIME: 10.4 s (ref 9.1–12.0)

## 2017-04-28 ENCOUNTER — Telehealth: Payer: Self-pay

## 2017-04-28 ENCOUNTER — Ambulatory Visit: Payer: Medicare Other | Admitting: Cardiology

## 2017-04-28 NOTE — Telephone Encounter (Signed)
Left detailed message per DPR:  Patient contacted pre-catheterization at [facility] scheduled for:  04/29/2017 @ 1200 Verified arrival time and place:  NT @ 1000 Confirmed AM meds to be taken pre-cath with sip of water: Last dose metformin this am-hold 48 hours post cath Hold amaryl/actos am of Take ASA Patient must have responsible person to drive home post procedure and observe patient for 24 hours Addl concerns:  Left this nurse name and # for call back if any further questions

## 2017-04-29 ENCOUNTER — Other Ambulatory Visit: Payer: Self-pay

## 2017-04-29 ENCOUNTER — Encounter (HOSPITAL_COMMUNITY): Payer: Self-pay | Admitting: Cardiology

## 2017-04-29 ENCOUNTER — Ambulatory Visit (HOSPITAL_COMMUNITY)
Admission: RE | Admit: 2017-04-29 | Discharge: 2017-04-30 | Disposition: A | Discharge status: Home / Self Care | Payer: Medicare Other | Source: Ambulatory Visit | Attending: Cardiology | Admitting: Cardiology

## 2017-04-29 ENCOUNTER — Encounter (HOSPITAL_COMMUNITY)
Admission: RE | Disposition: A | Discharge status: Home / Self Care | Payer: Self-pay | Source: Ambulatory Visit | Attending: Cardiology

## 2017-04-29 DIAGNOSIS — R338 Other retention of urine: Secondary | ICD-10-CM | POA: Insufficient documentation

## 2017-04-29 DIAGNOSIS — E785 Hyperlipidemia, unspecified: Secondary | ICD-10-CM | POA: Diagnosis present

## 2017-04-29 DIAGNOSIS — N401 Enlarged prostate with lower urinary tract symptoms: Secondary | ICD-10-CM | POA: Diagnosis not present

## 2017-04-29 DIAGNOSIS — I10 Essential (primary) hypertension: Secondary | ICD-10-CM | POA: Diagnosis not present

## 2017-04-29 DIAGNOSIS — I252 Old myocardial infarction: Secondary | ICD-10-CM | POA: Insufficient documentation

## 2017-04-29 DIAGNOSIS — I200 Unstable angina: Secondary | ICD-10-CM | POA: Diagnosis present

## 2017-04-29 DIAGNOSIS — G4733 Obstructive sleep apnea (adult) (pediatric): Secondary | ICD-10-CM | POA: Diagnosis not present

## 2017-04-29 DIAGNOSIS — I25110 Atherosclerotic heart disease of native coronary artery with unstable angina pectoris: Secondary | ICD-10-CM | POA: Diagnosis not present

## 2017-04-29 DIAGNOSIS — E119 Type 2 diabetes mellitus without complications: Secondary | ICD-10-CM

## 2017-04-29 DIAGNOSIS — K219 Gastro-esophageal reflux disease without esophagitis: Secondary | ICD-10-CM | POA: Insufficient documentation

## 2017-04-29 DIAGNOSIS — I4510 Unspecified right bundle-branch block: Secondary | ICD-10-CM | POA: Insufficient documentation

## 2017-04-29 DIAGNOSIS — E782 Mixed hyperlipidemia: Secondary | ICD-10-CM | POA: Diagnosis not present

## 2017-04-29 DIAGNOSIS — Z955 Presence of coronary angioplasty implant and graft: Secondary | ICD-10-CM | POA: Insufficient documentation

## 2017-04-29 DIAGNOSIS — K5909 Other constipation: Secondary | ICD-10-CM | POA: Insufficient documentation

## 2017-04-29 DIAGNOSIS — Z87891 Personal history of nicotine dependence: Secondary | ICD-10-CM | POA: Diagnosis not present

## 2017-04-29 DIAGNOSIS — I25119 Atherosclerotic heart disease of native coronary artery with unspecified angina pectoris: Secondary | ICD-10-CM | POA: Diagnosis present

## 2017-04-29 DIAGNOSIS — Z7984 Long term (current) use of oral hypoglycemic drugs: Secondary | ICD-10-CM | POA: Insufficient documentation

## 2017-04-29 HISTORY — PX: INTRAVASCULAR PRESSURE WIRE/FFR STUDY: CATH118243

## 2017-04-29 HISTORY — DX: ST elevation (STEMI) myocardial infarction of unspecified site: I21.3

## 2017-04-29 HISTORY — DX: Unspecified malignant neoplasm of skin, unspecified: C44.90

## 2017-04-29 HISTORY — DX: Type 2 diabetes mellitus without complications: E11.9

## 2017-04-29 HISTORY — DX: Unspecified osteoarthritis, unspecified site: M19.90

## 2017-04-29 HISTORY — PX: LEFT HEART CATH AND CORONARY ANGIOGRAPHY: CATH118249

## 2017-04-29 HISTORY — PX: CORONARY STENT INTERVENTION: CATH118234

## 2017-04-29 LAB — GLUCOSE, CAPILLARY
GLUCOSE-CAPILLARY: 160 mg/dL — AB (ref 65–99)
GLUCOSE-CAPILLARY: 139 mg/dL — AB (ref 65–99)
Glucose-Capillary: 229 mg/dL — ABNORMAL HIGH (ref 65–99)
Glucose-Capillary: 86 mg/dL (ref 65–99)

## 2017-04-29 LAB — POCT ACTIVATED CLOTTING TIME
Activated Clotting Time: 235 seconds
Activated Clotting Time: 296 seconds

## 2017-04-29 SURGERY — LEFT HEART CATH AND CORONARY ANGIOGRAPHY
Anesthesia: LOCAL

## 2017-04-29 MED ORDER — CLOPIDOGREL BISULFATE 300 MG PO TABS
ORAL_TABLET | ORAL | Status: AC
  Filled 2017-04-29: qty 2

## 2017-04-29 MED ORDER — LIDOCAINE HCL (PF) 1 % IJ SOLN
INTRAMUSCULAR | Status: DC | PRN
  Administered 2017-04-29: 3 mL

## 2017-04-29 MED ORDER — IOPAMIDOL (ISOVUE-300) INJECTION 61%
INTRAVENOUS | Status: AC
  Filled 2017-04-29: qty 50

## 2017-04-29 MED ORDER — ACETAMINOPHEN 325 MG PO TABS: 650.0000 mg | ORAL_TABLET | ORAL | Status: DC | PRN

## 2017-04-29 MED ORDER — ADENOSINE 12 MG/4ML IV SOLN
INTRAVENOUS | Status: AC
  Filled 2017-04-29: qty 24

## 2017-04-29 MED ORDER — SODIUM CHLORIDE 0.9% FLUSH
3.0000 mL | Freq: Two times a day (BID) | INTRAVENOUS | Status: DC
  Administered 2017-04-29: 3 mL via INTRAVENOUS

## 2017-04-29 MED ORDER — SODIUM CHLORIDE 0.9 % IV SOLN: 250.0000 mL | INTRAVENOUS | Status: DC | PRN

## 2017-04-29 MED ORDER — FENTANYL CITRATE (PF) 100 MCG/2ML IJ SOLN
INTRAMUSCULAR | Status: DC | PRN
  Administered 2017-04-29: 25 ug via INTRAVENOUS

## 2017-04-29 MED ORDER — SODIUM CHLORIDE 0.9 % WEIGHT BASED INFUSION
3.0000 mL/kg/h | INTRAVENOUS | Status: DC
  Administered 2017-04-29: 3 mL/kg/h via INTRAVENOUS

## 2017-04-29 MED ORDER — ONDANSETRON HCL 4 MG/2ML IJ SOLN: 4.0000 mg | Freq: Four times a day (QID) | INTRAMUSCULAR | Status: DC | PRN

## 2017-04-29 MED ORDER — NITROGLYCERIN 0.4 MG SL SUBL: 0.4000 mg | SUBLINGUAL_TABLET | SUBLINGUAL | Status: DC | PRN

## 2017-04-29 MED ORDER — LABETALOL HCL 5 MG/ML IV SOLN: 10.0000 mg | INTRAVENOUS | Status: AC | PRN

## 2017-04-29 MED ORDER — VERAPAMIL HCL 2.5 MG/ML IV SOLN
INTRAVENOUS | Status: AC
  Filled 2017-04-29: qty 2

## 2017-04-29 MED ORDER — PANTOPRAZOLE SODIUM 40 MG PO TBEC
40.0000 mg | DELAYED_RELEASE_TABLET | Freq: Every day | ORAL | Status: DC
  Administered 2017-04-29: 40 mg via ORAL
  Filled 2017-04-29: qty 1

## 2017-04-29 MED ORDER — MIDAZOLAM HCL 2 MG/2ML IJ SOLN
INTRAMUSCULAR | Status: AC
  Filled 2017-04-29: qty 2

## 2017-04-29 MED ORDER — SIMVASTATIN 5 MG PO TABS
5.0000 mg | ORAL_TABLET | ORAL | Status: DC
  Administered 2017-04-29: 5 mg via ORAL
  Filled 2017-04-29: qty 1

## 2017-04-29 MED ORDER — SODIUM CHLORIDE 0.9 % IV SOLN: INTRAVENOUS | Status: AC

## 2017-04-29 MED ORDER — HEPARIN (PORCINE) IN NACL 2-0.9 UNIT/ML-% IJ SOLN
INTRAMUSCULAR | Status: DC | PRN
  Administered 2017-04-29: 10 mL via INTRA_ARTERIAL

## 2017-04-29 MED ORDER — SODIUM CHLORIDE 0.9% FLUSH: 3.0000 mL | Freq: Two times a day (BID) | INTRAVENOUS | Status: DC

## 2017-04-29 MED ORDER — GLIMEPIRIDE 4 MG PO TABS
4.0000 mg | ORAL_TABLET | Freq: Two times a day (BID) | ORAL | Status: DC
  Administered 2017-04-29 – 2017-04-30 (×2): 4 mg via ORAL
  Filled 2017-04-29 (×3): qty 1

## 2017-04-29 MED ORDER — SODIUM CHLORIDE 0.9% FLUSH: 3.0000 mL | INTRAVENOUS | Status: DC | PRN

## 2017-04-29 MED ORDER — IOPAMIDOL (ISOVUE-370) INJECTION 76%
INTRAVENOUS | Status: AC
  Filled 2017-04-29: qty 50

## 2017-04-29 MED ORDER — ASPIRIN 81 MG PO CHEW: 81.0000 mg | CHEWABLE_TABLET | ORAL | Status: DC

## 2017-04-29 MED ORDER — HEPARIN (PORCINE) IN NACL 2-0.9 UNIT/ML-% IJ SOLN
INTRAMUSCULAR | Status: AC | PRN
  Administered 2017-04-29: 1000 mL

## 2017-04-29 MED ORDER — HEPARIN SODIUM (PORCINE) 1000 UNIT/ML IJ SOLN
INTRAMUSCULAR | Status: AC
  Filled 2017-04-29: qty 1

## 2017-04-29 MED ORDER — HYDRALAZINE HCL 20 MG/ML IJ SOLN: 5.0000 mg | INTRAMUSCULAR | Status: AC | PRN

## 2017-04-29 MED ORDER — ASPIRIN EC 81 MG PO TBEC
81.0000 mg | DELAYED_RELEASE_TABLET | Freq: Every day | ORAL | Status: DC
  Administered 2017-04-30: 10:00:00 81 mg via ORAL
  Filled 2017-04-29: qty 1

## 2017-04-29 MED ORDER — IOPAMIDOL (ISOVUE-370) INJECTION 76%
INTRAVENOUS | Status: AC
  Filled 2017-04-29: qty 125

## 2017-04-29 MED ORDER — CLOPIDOGREL BISULFATE 300 MG PO TABS
ORAL_TABLET | ORAL | Status: DC | PRN
  Administered 2017-04-29: 600 mg via ORAL

## 2017-04-29 MED ORDER — LOSARTAN POTASSIUM 25 MG PO TABS
25.0000 mg | ORAL_TABLET | Freq: Every day | ORAL | Status: DC
  Administered 2017-04-29 – 2017-04-30 (×2): 25 mg via ORAL
  Filled 2017-04-29 (×2): qty 1

## 2017-04-29 MED ORDER — MIDAZOLAM HCL 2 MG/2ML IJ SOLN
INTRAMUSCULAR | Status: DC | PRN
  Administered 2017-04-29: 1 mg via INTRAVENOUS

## 2017-04-29 MED ORDER — LIDOCAINE HCL (PF) 1 % IJ SOLN
INTRAMUSCULAR | Status: AC
  Filled 2017-04-29: qty 30

## 2017-04-29 MED ORDER — IOPAMIDOL (ISOVUE-370) INJECTION 76%
INTRAVENOUS | Status: DC | PRN
  Administered 2017-04-29: 110 mL via INTRA_ARTERIAL

## 2017-04-29 MED ORDER — HEPARIN (PORCINE) IN NACL 2-0.9 UNIT/ML-% IJ SOLN
INTRAMUSCULAR | Status: AC
  Filled 2017-04-29: qty 1000

## 2017-04-29 MED ORDER — ADENOSINE (DIAGNOSTIC) 140MCG/KG/MIN
INTRAVENOUS | Status: AC | PRN
  Administered 2017-04-29: 140 ug/kg/min via INTRAVENOUS

## 2017-04-29 MED ORDER — ANGIOPLASTY BOOK
Freq: Once | Status: AC
  Administered 2017-04-29: 22:00:00 1
  Filled 2017-04-29: qty 1

## 2017-04-29 MED ORDER — HEPARIN SODIUM (PORCINE) 1000 UNIT/ML IJ SOLN
INTRAMUSCULAR | Status: DC | PRN
  Administered 2017-04-29 (×2): 5000 [IU] via INTRAVENOUS
  Administered 2017-04-29: 3000 [IU] via INTRAVENOUS

## 2017-04-29 MED ORDER — SODIUM CHLORIDE 0.9 % WEIGHT BASED INFUSION: 1.0000 mL/kg/h | INTRAVENOUS | Status: DC

## 2017-04-29 MED ORDER — CLOPIDOGREL BISULFATE 75 MG PO TABS
75.0000 mg | ORAL_TABLET | Freq: Every day | ORAL | Status: DC
  Administered 2017-04-30: 10:00:00 75 mg via ORAL
  Filled 2017-04-29: qty 1

## 2017-04-29 MED ORDER — FENTANYL CITRATE (PF) 100 MCG/2ML IJ SOLN
INTRAMUSCULAR | Status: AC
  Filled 2017-04-29: qty 2

## 2017-04-29 SURGICAL SUPPLY — 24 items
BALLN SAPPHIRE 2.5X15 (BALLOONS) ×2
BALLN ~~LOC~~ EMERGE MR 3.25X15 (BALLOONS) ×2
BALLOON SAPPHIRE 2.5X15 (BALLOONS) IMPLANT
BALLOON ~~LOC~~ EMERGE MR 3.25X15 (BALLOONS) IMPLANT
CATH LAUNCHER 5F RADR (CATHETERS) IMPLANT
CATH MICROCATH NAVVUS (MICROCATHETER) IMPLANT
CATH OPTITORQUE TIG 4.0 5F (CATHETERS) ×1 IMPLANT
CATHETER LAUNCHER 5F RADR (CATHETERS) ×2
COVER PRB 48X5XTLSCP FOLD TPE (BAG) IMPLANT
COVER PROBE 5X48 (BAG) ×2
DEVICE RAD COMP TR BAND LRG (VASCULAR PRODUCTS) ×1 IMPLANT
GLIDESHEATH SLEND A-KIT 6F 22G (SHEATH) ×1 IMPLANT
GLIDESHEATH SLEND SS 6F .021 (SHEATH) ×1 IMPLANT
GUIDEWIRE INQWIRE 1.5J.035X260 (WIRE) IMPLANT
INQWIRE 1.5J .035X260CM (WIRE) ×2
KIT ENCORE 26 ADVANTAGE (KITS) ×1 IMPLANT
KIT ESSENTIALS PG (KITS) ×1 IMPLANT
KIT HEART LEFT (KITS) ×2 IMPLANT
MICROCATHETER NAVVUS (MICROCATHETER) ×2
PACK CARDIAC CATHETERIZATION (CUSTOM PROCEDURE TRAY) ×2 IMPLANT
STENT SYNERGY DES 3X24 (Permanent Stent) ×1 IMPLANT
TRANSDUCER W/STOPCOCK (MISCELLANEOUS) ×2 IMPLANT
TUBING CIL FLEX 10 FLL-RA (TUBING) ×2 IMPLANT
WIRE COUGAR XT STRL 190CM (WIRE) ×1 IMPLANT

## 2017-04-29 NOTE — Progress Notes (Signed)
TR BAND REMOVAL  LOCATION:  right radial  DEFLATED PER PROTOCOL:  Yes.    TIME BAND OFF / DRESSING APPLIED:   1715   SITE UPON ARRIVAL:   Level 1  SITE AFTER BAND REMOVAL:  Level 1  CIRCULATION SENSATION AND MOVEMENT:  Within Normal Limits  Yes.    COMMENTS:    

## 2017-04-29 NOTE — Interval H&P Note (Signed)
History and Physical Interval Note:  04/29/2017 11:33 AM  Eric Carter  has presented today for surgery, with the diagnosis of (progressive) unstable angina, cad  The various methods of treatment have been discussed with the patient and family. After consideration of risks, benefits and other options for treatment, the patient has consented to  Procedure(s): LEFT HEART CATH AND CORONARY ANGIOGRAPHY (N/A) with possible PERCUTANEOUS CORONARY INTERVENTION as a surgical intervention .  The patient's history has been reviewed, patient examined, no change in status, stable for surgery.  I have reviewed the patient's chart and labs.  Questions were answered to the patient's satisfaction.     Cath Lab Visit (complete for each Cath Lab visit)  Clinical Evaluation Leading to the Procedure:   ACS: No. -Progressive angina  Non-ACS:    Anginal Classification: CCS III  Anti-ischemic medical therapy: Minimal Therapy (1 class of medications)  Non-Invasive Test Results: No non-invasive testing performed  Prior CABG: No previous CABG   Glenetta Hew

## 2017-04-29 NOTE — Discharge Summary (Signed)
The patient has been seen in conjunction with Reino Bellis, NP. All aspects of care have been considered and discussed. The patient has been personally interviewed, examined, and all clinical data has been reviewed.   Digital images were reviewed.  RCA was treated after FFR was noted to be significantly decreased.  Severe diffuse disease in LAD is felt best treated with medical therapy.  Clinical symptom was exertional dyspnea on admission.  Ambulated with with phase 1 cardiac rehab and noted improvement in dyspnea.  We will initiate and up titrate anti-ischemic regimen including beta-blocker therapy and adding long-acting nitrates as tolerated as required by symptoms as OP.  Plan discharge today.             Discharge Summary    Patient ID: Eric Carter,  MRN: [id number], DOB/AGE: April 08, 1932 83 y.o.  Admit date: 04/29/2017 Discharge date: 04/30/2017  Primary Care Provider: Isaias Sakai Primary Cardiologist: Tamala Julian  Discharge Diagnoses    Principal Problem:   Progressive angina [facility]) Active Problems:   Coronary artery disease involving native coronary artery of native heart with angina pectoris [facility])   Essential hypertension, benign   Hyperlipidemia   Diabetes type 2, controlled ([place])   Urinary retention due to benign prostatic hyperplasia   Allergies Allergies  Allergen Reactions  . Lisinopril Cough  . Codeine     Wife reports "changes his whole personality"  . Pravastatin Sodium     Myalgias  . Simvastatin Other (See Comments)    Myalgias    Diagnostic Studies/Procedures    Cath: 04/29/17  Conclusion     Prox RCA lesion is 75% stenosed (FFR 0.78). -Site of previous angioplasty in setting of STEMI  A drug-eluting stent was successfully placed using a STENT SYNERGY DES 3X24. Postdilated to 3.34 mm  Post intervention, there is a 0% residual stenosis.  Dist RCA lesion is 45% stenosed.  Ost LAD to Prox LAD lesion is 50%  stenosed. Prox LAD lesion is 40% stenosed. Mid-distal LAD lesion is 65% stenosed. While it is possible that these lesions in tandem could be FFR positive, neither lesion alone is a great option for beneficial PCI.  There is mild left ventricular systolic dysfunction -with basal to mid inferior hypokinesis..  The left ventricular ejection fraction is 45-50% by visual estimate.   Severe single-vessel disease involving the previously occluded RCA.  This area was treated with a DES stent.   There is also moderate diffuse disease in the LAD that is probably best treated medically.  Relatively preserved LVEF with minimally elevated LVEDP.  Plan per discussion with Dr. Tamala Julian will be to treat the LAD lesion medically and reassess if symptoms are not improving.  The patient will be monitored overnight for post cath care and TR band removal.  He will be discharged in the morning if stable.  Dual antiplatelet therapy for minimum of 3-6 months, after which point aspirin can be discontinued.   Glenetta Hew, M.D., M.S.   _____________   History of Present Illness     Eric Carter is a 83 y.o. male OSA on CPAP, coronary artery disease with history of acute inferior infarction treated with angioplasty of the distal right coronary in 2007, HTN, HLD and DM presented to the office on 04/24/17 for SOB and fatigue.   Last seen by Dr. Tamala Julian 07/2016. Continued simvastatin Monday, Wednesday, and Friday. This is followed by primary care. LDL target of 70 or less. He refuses lipid clinic evaluation.  The patient has progressive worsening of dyspnea on exertion for past 6-8 months.  Worsen in  last 1 month.  No improvement despite antibiotic treatment for cold.  He used to go for exercise 3 times per week however unable to do so in the past 2 months due to worsening dyspnea and fatigue. He did not require any nitroglycerin.  Denied orthopnea, palpitation, PND, lower extremity edema or dizziness.  He was  taken off aspirin for his prostate cancer surgery in May 2018.  He has not started since then. Continued to take simvastatin every other day. Given his symptoms options were discussed for possible invasive, vs noninvasive treatment. Patient opted to undergoing outpatient cardiac cath.   Hospital Course     Underwent cardiac cath with Dr. Ellyn Hack noted above with 75% pRCA lesion with FFR 0.78 (site of previous angioplasty in the setting of  STEMI), with successful PCI/DES x1. Also had diffuse disease in the LAD with plans to treat medically. Plan for DAPT with ASA/Plavix for 3-6 months, then can consider stopping ASA. Post cath labs showed Cr 1.09 and Hgb 12.6. No complications noted post cath. Worked well with cardiac rehab with overall improvement is dyspnea.   Jeri Cos was seen by Dr. Tamala Julian and determined stable for discharge home. Follow up in the office has been arranged. Medications are listed below.   _____________  Discharge Vitals Blood pressure 129/65, pulse (!) 59, temperature 97.6 F (36.4 C), temperature source Oral, resp. rate 16, height 6' (1.829 m), weight 209 lb 7 oz (95 kg), SpO2 94 %.  Filed Weights   04/29/17 0955 04/30/17 0432  Weight: 210 lb (95.3 kg) 209 lb 7 oz (95 kg)    Labs & Radiologic Studies    CBC Recent Labs    04/30/17 0253  WBC 5.1  HGB 12.6*  HCT 36.9*  MCV 94.4  PLT 725*   Basic Metabolic Panel Recent Labs    04/30/17 0253  NA 138  K 3.6  CL 106  CO2 25  GLUCOSE 96  BUN 12  CREATININE 1.09  CALCIUM 9.0   Liver Function Tests No results for input(s): AST, ALT, ALKPHOS, BILITOT, PROT, ALBUMIN in the last 72 hours. No results for input(s): LIPASE, AMYLASE in the last 72 hours. Cardiac Enzymes No results for input(s): CKTOTAL, CKMB, CKMBINDEX, TROPONINI in the last 72 hours. BNP Invalid input(s): POCBNP D-Dimer No results for input(s): DDIMER in the last 72 hours. Hemoglobin A1C No results for input(s): HGBA1C in the last 72  hours. Fasting Lipid Panel No results for input(s): CHOL, HDL, LDLCALC, TRIG, CHOLHDL, LDLDIRECT in the last 72 hours. Thyroid Function Tests No results for input(s): TSH, T4TOTAL, T3FREE, THYROIDAB in the last 72 hours.  Invalid input(s): FREET3 _____________  No results found. Disposition   Pt is being discharged home today in good condition.  Follow-up Plans & Appointments    Follow-up Information    Consuelo Pandy, PA-C Follow up on 05/06/2017.   Specialties:  Cardiology, Radiology Why:  at 11:30am for your follow up appt Contact information: [place] [address] [phone]          Discharge Instructions    Amb Referral to Cardiac Rehabilitation   Complete by:  As directed    To [facility]   Diagnosis:   Coronary Stents PTCA     Call MD for:  redness, tenderness, or signs of infection (pain, swelling, redness, odor or green/yellow discharge around incision  site)   Complete by:  As directed    Diet - low sodium heart healthy   Complete by:  As directed    Discharge instructions   Complete by:  As directed    Radial Site Care Refer to this sheet in the next few weeks. These instructions provide you with information on caring for yourself after your procedure. Your caregiver may also give you more specific instructions. Your treatment has been planned according to current medical practices, but problems sometimes occur. Call your caregiver if you have any problems or questions after your procedure. HOME CARE INSTRUCTIONS You may shower the day after the procedure.Remove the bandage (dressing) and gently wash the site with plain soap and water.Gently pat the site dry.  Do not apply powder or lotion to the site.  Do not submerge the affected site in water for 3 to 5 days.  Inspect the site at least twice daily.  Do not flex or bend the affected arm for 24 hours.  No lifting over 5 pounds (2.3 kg) for 5 days after your procedure.    Do not drive home if you are discharged the same day of the procedure. Have someone else drive you.  You may drive 24 hours after the procedure unless otherwise instructed by your caregiver.  What to expect: Any bruising will usually fade within 1 to 2 weeks.  Blood that collects in the tissue (hematoma) may be painful to the touch. It should usually decrease in size and tenderness within 1 to 2 weeks.  SEEK IMMEDIATE MEDICAL CARE IF: You have unusual pain at the radial site.  You have redness, warmth, swelling, or pain at the radial site.  You have drainage (other than a small amount of blood on the dressing).  You have chills.  You have a fever or persistent symptoms for more than 72 hours.  You have a fever and your symptoms suddenly get worse.  Your arm becomes pale, cool, tingly, or numb.  You have heavy bleeding from the site. Hold pressure on the site.   PLEASE DO NOT MISS ANY DOSES OF YOUR PLAVIX!!!!! Also keep a log of you blood pressures and bring back to your follow up appt. Please call the office with any questions.   Patients taking blood thinners should generally stay away from medicines like ibuprofen, Advil, Motrin, naproxen, and Aleve due to risk of stomach bleeding. You may take Tylenol as directed or talk to your primary doctor about alternatives.  Some studies suggest Prilosec/Omeprazole interacts with Plavix. We changed your Prilosec/Omeprazole to the equivalent dose of Protonix for less chance of interaction.   Increase activity slowly   Complete by:  As directed       Discharge Medications     Medication List    STOP taking these medications   omeprazole 20 MG capsule Commonly known as:  PRILOSEC Replaced by:  pantoprazole 40 MG tablet     TAKE these medications   aspirin EC 81 MG tablet Take 1 tablet (81 mg total) daily by mouth.   clopidogrel 75 MG tablet Commonly known as:  PLAVIX Take 1 tablet (75 mg total) daily with breakfast by mouth.    glimepiride 4 MG tablet Commonly known as:  AMARYL Take 4 mg by mouth 2 (two) times daily.   losartan 25 MG tablet Commonly known as:  COZAAR TAKE 1 TABLET (25 MG TOTAL) BY MOUTH DAILY.   metFORMIN 500 MG tablet Commonly known as:  GLUCOPHAGE Take 500-1,000  mg 2 (two) times daily by mouth. Take 1 tablet (500 mg) in the morning & 2 tablets (1000 mg) by mouth at night. Notes to patient:  HOLD FOR 48 HRS   nitroGLYCERIN 0.4 MG SL tablet Commonly known as:  NITROSTAT Place 1 tablet (0.4 mg total) under the tongue every 5 (five) minutes as needed for chest pain.   pantoprazole 40 MG tablet Commonly known as:  PROTONIX Take 1 tablet (40 mg total) at bedtime by mouth. Replaces:  omeprazole 20 MG capsule   pioglitazone 30 MG tablet Commonly known as:  ACTOS Take 30 mg daily by mouth.   simvastatin 5 MG tablet Commonly known as:  ZOCOR Take 5 mg by mouth every other day. Takes in the evening        Aspirin prescribed at discharge?  Yes High Intensity Statin Prescribed? (Lipitor 40-80mg  or Crestor 20-40mg ): No: statin intolerance, on simvastatin  Beta Blocker Prescribed? Yes For EF <40%, was ACEI/ARB Prescribed? Yes ADP Receptor Inhibitor Prescribed? (i.e. Plavix etc.-Includes Medically Managed Patients): Yes For EF <40%, Aldosterone Inhibitor Prescribed? No: EF ok Was EF assessed during THIS hospitalization? Yes Was Cardiac Rehab II ordered? (Included Medically managed Patients): Yes   Outstanding Labs/Studies   N/a  Duration of Discharge Encounter   Greater than 30 minutes including physician time.  Signed, Reino Bellis NP-C 04/30/2017, 12:00 PM

## 2017-04-30 DIAGNOSIS — G4733 Obstructive sleep apnea (adult) (pediatric): Secondary | ICD-10-CM | POA: Diagnosis not present

## 2017-04-30 DIAGNOSIS — K5909 Other constipation: Secondary | ICD-10-CM | POA: Diagnosis not present

## 2017-04-30 DIAGNOSIS — I4510 Unspecified right bundle-branch block: Secondary | ICD-10-CM | POA: Diagnosis not present

## 2017-04-30 DIAGNOSIS — E1121 Type 2 diabetes mellitus with diabetic nephropathy: Secondary | ICD-10-CM | POA: Diagnosis not present

## 2017-04-30 DIAGNOSIS — I200 Unstable angina: Secondary | ICD-10-CM | POA: Diagnosis not present

## 2017-04-30 DIAGNOSIS — Z7984 Long term (current) use of oral hypoglycemic drugs: Secondary | ICD-10-CM | POA: Diagnosis not present

## 2017-04-30 DIAGNOSIS — I25110 Atherosclerotic heart disease of native coronary artery with unstable angina pectoris: Secondary | ICD-10-CM | POA: Diagnosis not present

## 2017-04-30 DIAGNOSIS — E781 Pure hyperglyceridemia: Secondary | ICD-10-CM | POA: Diagnosis not present

## 2017-04-30 DIAGNOSIS — I252 Old myocardial infarction: Secondary | ICD-10-CM | POA: Diagnosis not present

## 2017-04-30 DIAGNOSIS — I25119 Atherosclerotic heart disease of native coronary artery with unspecified angina pectoris: Secondary | ICD-10-CM | POA: Diagnosis not present

## 2017-04-30 DIAGNOSIS — E119 Type 2 diabetes mellitus without complications: Secondary | ICD-10-CM | POA: Diagnosis not present

## 2017-04-30 DIAGNOSIS — N401 Enlarged prostate with lower urinary tract symptoms: Secondary | ICD-10-CM | POA: Diagnosis not present

## 2017-04-30 DIAGNOSIS — K219 Gastro-esophageal reflux disease without esophagitis: Secondary | ICD-10-CM | POA: Diagnosis not present

## 2017-04-30 DIAGNOSIS — R338 Other retention of urine: Secondary | ICD-10-CM | POA: Diagnosis not present

## 2017-04-30 DIAGNOSIS — I10 Essential (primary) hypertension: Secondary | ICD-10-CM | POA: Diagnosis not present

## 2017-04-30 DIAGNOSIS — E785 Hyperlipidemia, unspecified: Secondary | ICD-10-CM | POA: Diagnosis not present

## 2017-04-30 DIAGNOSIS — Z87891 Personal history of nicotine dependence: Secondary | ICD-10-CM | POA: Diagnosis not present

## 2017-04-30 DIAGNOSIS — Z955 Presence of coronary angioplasty implant and graft: Secondary | ICD-10-CM | POA: Diagnosis not present

## 2017-04-30 LAB — BASIC METABOLIC PANEL
Anion gap: 7 (ref 5–15)
BUN: 12 mg/dL (ref 6–20)
CALCIUM: 9 mg/dL (ref 8.9–10.3)
CO2: 25 mmol/L (ref 22–32)
CREATININE: 1.09 mg/dL (ref 0.61–1.24)
Chloride: 106 mmol/L (ref 101–111)
GFR calc non Af Amer: >60 mL/min (ref >60)
Glucose, Bld: 96 mg/dL (ref 65–99)
Potassium: 3.6 mmol/L (ref 3.5–5.1)
SODIUM: 138 mmol/L (ref 135–145)

## 2017-04-30 LAB — CBC
HCT: 36.9 % — ABNORMAL LOW (ref 39.0–52.0)
Hemoglobin: 12.6 g/dL — ABNORMAL LOW (ref 13.0–17.0)
MCH: 32.2 pg (ref 26.0–34.0)
MCHC: 34.1 g/dL (ref 30.0–36.0)
MCV: 94.4 fL (ref 78.0–100.0)
PLATELETS: 119 10*3/uL — AB (ref 150–400)
RBC: 3.91 MIL/uL — AB (ref 4.22–5.81)
RDW: 14.7 % (ref 11.5–15.5)
WBC: 5.1 10*3/uL (ref 4.0–10.5)

## 2017-04-30 LAB — GLUCOSE, CAPILLARY
Glucose-Capillary: 109 mg/dL — ABNORMAL HIGH (ref 65–99)
Glucose-Capillary: 140 mg/dL — ABNORMAL HIGH (ref 65–99)

## 2017-04-30 MED ORDER — PANTOPRAZOLE SODIUM 40 MG PO TBEC: 40.0000 mg | DELAYED_RELEASE_TABLET | Freq: Every day | ORAL | 2 refills | Status: DC

## 2017-04-30 MED ORDER — CLOPIDOGREL BISULFATE 75 MG PO TABS: 75.0000 mg | ORAL_TABLET | Freq: Every day | ORAL | 2 refills | Status: DC

## 2017-04-30 NOTE — Care Management Note (Signed)
Case Management Note  Patient Details  Name: Eric Carter MRN: [id number] Date of Birth: [birth year]  Subjective/Objective:   From home with spouse, pta indep, s/p coronary stent intervention, will be on plavix, for dc today, no needs.                 Action/Plan:   Expected Discharge Date:                  Expected Discharge Plan:  Home/Self Care  In-House Referral:     Discharge planning Services  CM Consult  Post Acute Care Choice:    Choice offered to:     DME Arranged:    DME Agency:     HH Arranged:    HH Agency:     Status of Service:  Completed, signed off  If discussed at [employer] of Stay Meetings, dates discussed:    Additional Comments:  Zenon Mayo, RN 04/30/2017, 10:30 AM

## 2017-04-30 NOTE — Progress Notes (Signed)
CARDIAC REHAB PHASE I   PRE:  Rate/Rhythm: 64 SR    BP: sitting 128/57    SaO2: 92 RA  MODE:  Ambulation: 450 ft   POST:  Rate/Rhythm: 87 SR    BP: sitting 124/83     SaO2: 97 RA  Pt ambulated 450 ft with some SOB with distance. With questioning pt has only been able to walk about 150 ft PTA with significant SOB. He sts his SOB is improved moderately today and he was able to walk farther (and pace was moderate as well). Discussed with pt and wife that he probably has some degree of deconditioning as well but that if he notices increased SOB or decreased activity again to call Dr. Tamala Julian. Ed completed including Plavix/ASA, restrictions, diet, ex, NTG, and CRPII. Will refer to [place], ACSM 04/30/2017 9:47 AM

## 2017-04-30 NOTE — Progress Notes (Signed)
Patient refused CPAP at this time.

## 2017-04-30 NOTE — Progress Notes (Signed)
DISCHARGE PLANNING:   The patient has been seen in conjunction with Reino Bellis, NP. All aspects of care have been considered and discussed. The patient has been personally interviewed, examined, and all clinical data has been reviewed.   Digital images were reviewed.  RCA was treated after FFR was noted to be significantly decreased.  Severe diffuse disease in LAD is felt best treated with medical therapy.  Clinical symptom was exertional dyspnea on admission.  Plan to ambulate vigorously today and if dyspnea still present, we will up titrate anti-ischemic regimen including beta-blocker therapy and adding long-acting nitrates.  Plan discharge later today.  Further medication titration as required by symptoms.

## 2017-05-06 ENCOUNTER — Ambulatory Visit: Payer: Medicare Other | Admitting: Cardiology

## 2017-05-06 ENCOUNTER — Encounter: Payer: Self-pay | Admitting: Cardiology

## 2017-05-06 ENCOUNTER — Encounter (INDEPENDENT_AMBULATORY_CARE_PROVIDER_SITE_OTHER): Payer: Self-pay

## 2017-05-06 VITALS — BP 112/60 | HR 65 | Ht 72.0 in | Wt 218.1 lb

## 2017-05-06 DIAGNOSIS — I2510 Atherosclerotic heart disease of native coronary artery without angina pectoris: Secondary | ICD-10-CM | POA: Diagnosis not present

## 2017-05-06 DIAGNOSIS — Z9861 Coronary angioplasty status: Secondary | ICD-10-CM | POA: Diagnosis not present

## 2017-05-06 MED ORDER — ISOSORBIDE MONONITRATE ER 30 MG PO TB24: 30.0000 mg | ORAL_TABLET | Freq: Every day | ORAL | 3 refills | Status: DC

## 2017-05-06 NOTE — Progress Notes (Signed)
05/06/2017 Eric Carter   11-25-1931  [id number]  Primary Physician [place], Lauralyn Primes, DO Primary Cardiologist: Dr. Tamala Julian    Reason for Visit/CC: [facility] F/u for CAD  HPI: Eric Carter a 83 y.o.male, who presents to clinic today for post hospital f/u. he is followed by Dr. Tamala Julian and has a h/oOSA on CPAP,coronary artery diseasewithhistory of acute inferior infarction treated with angioplasty of the distal right coronary in 2007, HTN, HLD and DM. He presented to the office on 04/24/17 for SOB and fatigue. No chest pain, however there were concerns that symptoms were concerning for unstable angina. He was set up for [facility] with Dr. Ellyn Hack and noted to have 75% pRCA lesion with FFR 0.78 (site of previous angioplasty in the setting of  STEMI), with successful PCI/DES x1. Also had diffuse disease in the LAD with plans to treat medically. He was placed on DAPT w/ ASA and Plavix. Also continued on statin therapy, ARB and diabetes meds.  He presents back for f/u. He is here with his wife. He reports he has done ok since discharge. His breathing is overall improved, however he still has exertional dyspnea with moderate to heavy physical activities. No issues with ADLs. No chest pain. He reports full med compliance. BP is 112/60. HR 65 bpm. He does not appear to be on any antianginals.   His right radial cath site is stable. No complications.   LHC 04/29/17 Conclusion     Prox RCA lesion is 75% stenosed (FFR 0.78). -Site of previous angioplasty in setting of STEMI  A drug-eluting stent was successfully placed using a STENT SYNERGY DES 3X24. Postdilated to 3.34 mm  Post intervention, there is a 0% residual stenosis.  Dist RCA lesion is 45% stenosed.  Ost LAD to Prox LAD lesion is 50% stenosed. Prox LAD lesion is 40% stenosed. Mid-distal LAD lesion is 65% stenosed. While it is possible that these lesions in tandem could be FFR positive, neither lesion alone is a great  option for beneficial PCI.  There is mild left ventricular systolic dysfunction -with basal to mid inferior hypokinesis..  The left ventricular ejection fraction is 45-50% by visual estimate.   Severe single-vessel disease involving the previously occluded RCA.  This area was treated with a DES stent.   There is also moderate diffuse disease in the LAD that is probably best treated medically.  Relatively preserved LVEF with minimally elevated LVEDP.     Current Meds  Medication Sig  . aspirin EC 81 MG tablet Take 1 tablet (81 mg total) daily by mouth.  . clopidogrel (PLAVIX) 75 MG tablet Take 1 tablet (75 mg total) daily with breakfast by mouth.  Marland Kitchen glimepiride (AMARYL) 4 MG tablet Take 4 mg by mouth 2 (two) times daily.   Marland Kitchen losartan (COZAAR) 25 MG tablet TAKE 1 TABLET (25 MG TOTAL) BY MOUTH DAILY.  . metFORMIN (GLUCOPHAGE) 500 MG tablet Take 500-1,000 mg 2 (two) times daily by mouth. Take 1 tablet (500 mg) in the morning & 2 tablets (1000 mg) by mouth at night.  . nitroGLYCERIN (NITROSTAT) 0.4 MG SL tablet Place 1 tablet (0.4 mg total) under the tongue every 5 (five) minutes as needed for chest pain.  . pantoprazole (PROTONIX) 40 MG tablet Take 1 tablet (40 mg total) at bedtime by mouth.  . pioglitazone (ACTOS) 30 MG tablet Take 30 mg daily by mouth.   . simvastatin (ZOCOR) 5 MG tablet Take 5 mg by mouth every other day. Takes in  the evening   Allergies  Allergen Reactions  . Lisinopril Cough  . Codeine     Wife reports "changes his whole personality"  . Pravastatin Sodium     Myalgias  . Simvastatin Other (See Comments)    Myalgias   Past Medical History:  Diagnosis Date  . Arthritis    "all over" (04/29/2017)  . BPH (benign prostatic hyperplasia)   . Chronic constipation   . Coronary artery disease cardiologist-  dr Daneen Schick   hx STEMI inferior  in 2007, distla RCA angioplasty  . First degree heart block   . GERD (gastroesophageal reflux disease)   . History of  atrial fibrillation without current medication    episode Atrial Fib. during cardiac cath. 02-28-2006 resolved before discharge  . History of urinary retention    08/2010;  08/2016  . Hyperlipidemia   . Hypertension   . OSA on CPAP    per sleetp study 10-08-2015 Moderate with AHI 19/hr now on CPAP  . RBBB (right bundle branch block)   . Skin cancer    "nose; left ear X 2" (04/29/2017)  . STEMI (ST elevation myocardial infarction) ([place]) 02/28/2006    inferior STEMI  s/p  RCA angioplasty  . Type II diabetes mellitus ([place])   . Wears glasses    Family History  Problem Relation Age of Onset  . Colon cancer Father   . Stomach cancer Neg Hx    Past Surgical History:  Procedure Laterality Date  . CARDIAC CATHETERIZATION  02-28-2006  dr Daneen Schick   PCI distal RCA /  mLAD  50%,  inferior akinesis, develpment of atrial fibrillation during case,  ef 50%  . CARDIOVASCULAR STRESS TEST  09/20/2002   normal nuclear study w/ no ischemia (diaphragmatic attenuation of the inferior wall)/  normal LV function and wall motion, ef 72%  . CATARACT EXTRACTION W/ INTRAOCULAR LENS  IMPLANT, BILATERAL Bilateral 2015  . COLONOSCOPY  last one 07-02-2013  . CORONARY ANGIOPLASTY WITH STENT PLACEMENT    . CORONARY STENT INTERVENTION N/A 04/29/2017   Performed by Leonie Man, MD at [facility]  . INTRAVASCULAR PRESSURE WIRE/FFR STUDY N/A 04/29/2017   Performed by Leonie Man, MD at [place] [facility]  . LEFT HEART CATH AND CORONARY ANGIOGRAPHY N/A 04/29/2017   Performed by Leonie Man, MD at [place] [facility]  . SECOND STAGE TRANSURETHRAL RESECTION OF THE PROSTATE (TURP) N/A 11/07/2016   Performed by Carolan Clines, MD at [facility]  . SKIN CANCER EXCISION     "nose"  . THULIUM LASER TURP (TRANSURETHRAL RESECTION OF PROSTATE) N/A 10/07/2016   Performed by Carolan Clines, MD at [facility]  . TONSILLECTOMY  child   Social History   Socioeconomic History  .  Marital status: Married    Spouse name: Not on file  . Number of children: Not on file  . Years of education: Not on file  . Highest education level: Not on file  Social Needs  . Financial resource strain: Not on file  . Food insecurity - worry: Not on file  . Food insecurity - inability: Not on file  . Transportation needs - medical: Not on file  . Transportation needs - non-medical: Not on file  Occupational History  . Not on file  Tobacco Use  . Smoking status: Former Smoker    Types: Cigars    Last attempt to quit: 10/02/1970    Years since quitting: 46.6  .  Smokeless tobacco: Current User    Types: Chew  . Tobacco comment: per pt occasionally chews tobacco  Substance and Sexual Activity  . Alcohol use: Yes    Comment: RARELY   . Drug use: No  . Sexual activity: Not on file  Other Topics Concern  . Not on file  Social History Narrative  . Not on file     Review of Systems: General: negative for chills, fever, night sweats or weight changes.  Cardiovascular: negative for chest pain, dyspnea on exertion, edema, orthopnea, palpitations, paroxysmal nocturnal dyspnea or shortness of breath Dermatological: negative for rash Respiratory: negative for cough or wheezing Urologic: negative for hematuria Abdominal: negative for nausea, vomiting, diarrhea, bright red blood per rectum, melena, or hematemesis Neurologic: negative for visual changes, syncope, or dizziness All other systems reviewed and are otherwise negative except as noted above.   Physical Exam:  Blood pressure 112/60, pulse 65, height 6' (1.829 m), weight 218 lb 1.9 oz (98.9 kg), SpO2 95 %.  General appearance: alert, cooperative and no distress Neck: no carotid bruit and no JVD Lungs: clear to auscultation bilaterally Heart: regular rate and rhythm, S1, S2 normal, no murmur, click, rub or gallop Extremities: extremities normal, atraumatic, no cyanosis or edema Pulses: 2+ and symmetric Skin: Skin color,  texture, turgor normal. No rashes or lesions Neurologic: Grossly normal  EKG not performed -- personally reviewed   ASSESSMENT AND PLAN:   1. CAD:  H/o acute inferior infarction treated with angioplasty of the distal right coronary in 2007 and recent repeat intervention of the RCA 04/29/17, successful PCI/DES x1 to proximal RCA per Dr. Ellyn Hack. Also noted on recent cath to have diffuse disease in the LAD, 40-65%, with plans to treat medically. He denies CP and dyspnea has improved. No symptoms with basic ADLs, however he continues to have mild dyspnea with moderate to heavy physical activity. Pt currently not on antianginal therapy. BP is 112/60 and resting HR 65 bpm. I've reviewed meds and pt case with Dr. Tamala Julian, who is also in the office today. He has recommended that we stop Losartan to allow room to initiate antianginal therapy. We will add Imdur 30 mg daily. We will bring back in 3-4 weeks to reassess response to nitrate therapy. If BP and HR allows, we will then try to initiate BB therapy with metoprolol. Pt to f/u w/ APP on a day Dr. Tamala Julian is in clinic. Continue DAPT w/ ASA and Plavix and statin therapy.    Follow-Up in 3-4 weeks with APP.   Eric Carter, MHS [facility] 05/06/2017 11:23 AM

## 2017-05-06 NOTE — Patient Instructions (Addendum)
Medication Instructions:   STOP TAKING LOSARTAN   START TAKING IMDUR 30 MG ONCE A DAY   If you need a refill on your cardiac medications before your next appointment, please call your pharmacy.  Labwork: NONE ORDERED  TODAY   Testing/Procedures: NONE ORDERED  TODAY   Follow-Up:  IN 3 TO 4 WEEKS  WITH AN APP (ON A  DAY DR Tamala Julian IN OFFICE  IF POSSIBLE ) OR JUST WITH AN APP    Any Other Special Instructions Will Be Listed Below (If Applicable).

## 2017-05-13 ENCOUNTER — Ambulatory Visit: Payer: Medicare Other | Admitting: Cardiology

## 2017-05-16 ENCOUNTER — Ambulatory Visit: Payer: Medicare Other | Admitting: Cardiology

## 2017-05-27 DIAGNOSIS — I25118 Atherosclerotic heart disease of native coronary artery with other forms of angina pectoris: Secondary | ICD-10-CM | POA: Diagnosis not present

## 2017-05-27 DIAGNOSIS — E1122 Type 2 diabetes mellitus with diabetic chronic kidney disease: Secondary | ICD-10-CM | POA: Diagnosis not present

## 2017-05-27 NOTE — Progress Notes (Signed)
Cardiology Office Note:    Date:  05/30/2017   ID:  Eric Carter, DOB [birth year], MRN [id number]  PCP:  Isaias Sakai, DO  Cardiologist:  Dr. Tamala Julian  Referring MD: Alonna Buckler*   Chief Complaint  Patient presents with  . Follow-up    CAD    History of Present Illness:    Eric Carter is a 83 y.o. male with a past medical history significant for OSA on CPAP, HTN, HLD, DM and CAD s/p acute anterior STEMI treated with PCI to RCA in 2007 and recent LHC for shortness of breath and fatigue revealing 75% stenosis of pRCA treated with DES by Dr. Ellyn Hack. He also has diffuse disease in the LAD being treated medically. He is on DAPT with aspirin and Plavix as well as statin, ARB and diabetes meds.   He came in for hospital follow up after his cath on 05/06/2017, seen by Ellen Henri, PA at which time he denied chest pain but was continuing to have mild dyspnea with moderate to heavy physical activity. Imdur 30 mg was added and losartan stopped to make room in blood pressure.   Eric Carter is feeling better now. He is able to walk to his mailbox and back which is up hill to his house without any dyspnea, which he was having trouble with before. He denies chest pain, palpitations, orthopnea, PND or edema. He does have mild lightheadedness with standing up int he mid mornings after he has taken his medications. This does not really bother him, he says "its not bad" and he has had no falls.   The patient is here today with his wife for follow up of the above med changes in therapy. He seems to be tolerating them well. He is using his CPAP regularly. He has lost about 12 pounds and his recent A1c was down to 6.6 so his diabetes meds were adjusted. Prior to his cath he was exercising at [facility] about 3 times per week. Since his cath he has been making laps inside his house. He hopes to get back to start exercise at the gym on Monday. He also has an appt for cardiac  rehab orientation on Monday at [facility].    Past Medical History:  Diagnosis Date  . Arthritis    "all over" (04/29/2017)  . BPH (benign prostatic hyperplasia)   . Chronic constipation   . Coronary artery disease cardiologist-  dr Daneen Schick   hx STEMI inferior  in 2007, distla RCA angioplasty  . First degree heart block   . GERD (gastroesophageal reflux disease)   . History of atrial fibrillation without current medication    episode Atrial Fib. during cardiac cath. 02-28-2006 resolved before discharge  . History of urinary retention    08/2010;  08/2016  . Hyperlipidemia   . Hypertension   . OSA on CPAP    per sleetp study 10-08-2015 Moderate with AHI 19/hr now on CPAP  . RBBB (right bundle branch block)   . Skin cancer    "nose; left ear X 2" (04/29/2017)  . STEMI (ST elevation myocardial infarction) ([place]) 02/28/2006    inferior STEMI  s/p  RCA angioplasty  . Type II diabetes mellitus ([place])   . Wears glasses     Past Surgical History:  Procedure Laterality Date  . CARDIAC CATHETERIZATION  02-28-2006  dr Daneen Schick   PCI distal RCA /  mLAD  50%,  inferior akinesis, develpment of atrial fibrillation during  case,  ef 50%  . CARDIOVASCULAR STRESS TEST  09/20/2002   normal nuclear study w/ no ischemia (diaphragmatic attenuation of the inferior wall)/  normal LV function and wall motion, ef 72%  . CATARACT EXTRACTION W/ INTRAOCULAR LENS  IMPLANT, BILATERAL Bilateral 2015  . COLONOSCOPY  last one 07-02-2013  . CORONARY ANGIOPLASTY WITH STENT PLACEMENT    . CORONARY STENT INTERVENTION N/A 04/29/2017   Procedure: CORONARY STENT INTERVENTION;  Surgeon: Leonie Man, MD;  Location: [place] [facility];  Service: Cardiovascular;  Laterality: N/A;  . INTRAVASCULAR PRESSURE WIRE/FFR STUDY N/A 04/29/2017   Procedure: INTRAVASCULAR PRESSURE WIRE/FFR STUDY;  Surgeon: Leonie Man, MD;  Location: [place] [facility];  Service: Cardiovascular;  Laterality: N/A;  . LEFT  HEART CATH AND CORONARY ANGIOGRAPHY N/A 04/29/2017   Procedure: LEFT HEART CATH AND CORONARY ANGIOGRAPHY;  Surgeon: Leonie Man, MD;  Location: [place] [facility];  Service: Cardiovascular;  Laterality: N/A;  . SKIN CANCER EXCISION     "nose"  . THULIUM LASER TURP (TRANSURETHRAL RESECTION OF PROSTATE) N/A 10/07/2016   Procedure: THULIUM LASER TURP (TRANSURETHRAL RESECTION OF PROSTATE);  Surgeon: Carolan Clines, MD;  Location: [facility];  Service: Urology;  Laterality: N/A;  . TONSILLECTOMY  child  . TRANSURETHRAL RESECTION OF PROSTATE N/A 11/07/2016   Procedure: SECOND STAGE TRANSURETHRAL RESECTION OF THE PROSTATE (TURP);  Surgeon: Carolan Clines, MD;  Location: [facility];  Service: Urology;  Laterality: N/A;    Current Medications: Current Meds  Medication Sig  . aspirin EC 81 MG tablet Take 1 tablet (81 mg total) daily by mouth.  . clopidogrel (PLAVIX) 75 MG tablet Take 1 tablet (75 mg total) daily with breakfast by mouth.  . isosorbide mononitrate (IMDUR) 30 MG 24 hr tablet Take 1 tablet (30 mg total) by mouth daily.  . metFORMIN (GLUCOPHAGE) 500 MG tablet Take 500-1,000 mg 2 (two) times daily by mouth. Take 1 tablet (500 mg) in the morning & 2 tablets (1000 mg) by mouth at night.  . nitroGLYCERIN (NITROSTAT) 0.4 MG SL tablet Place 1 tablet (0.4 mg total) under the tongue every 5 (five) minutes as needed for chest pain.  . pantoprazole (PROTONIX) 40 MG tablet Take 1 tablet (40 mg total) at bedtime by mouth.  . simvastatin (ZOCOR) 5 MG tablet Take 5 mg by mouth every other day. Takes in the evening     Allergies:   Lisinopril; Codeine; Pravastatin sodium; and Simvastatin   Social History   Socioeconomic History  . Marital status: Married    Spouse name: None  . Number of children: None  . Years of education: None  . Highest education level: None  Social Needs  . Financial resource strain: None  . Food insecurity - worry: None  . Food insecurity -  inability: None  . Transportation needs - medical: None  . Transportation needs - non-medical: None  Occupational History  . None  Tobacco Use  . Smoking status: Former Smoker    Types: Cigars    Last attempt to quit: 10/02/1970    Years since quitting: 46.6  . Smokeless tobacco: Current User    Types: Chew  . Tobacco comment: per pt occasionally chews tobacco  Substance and Sexual Activity  . Alcohol use: Yes    Comment: RARELY   . Drug use: No  . Sexual activity: None  Other Topics Concern  . None  Social History Narrative  . None     Family History: The patient's  family history includes Colon cancer in his father. There is no history of Stomach cancer. ROS:   Please see the history of present illness.     All other systems reviewed and are negative.  EKGs/Labs/Other Studies Reviewed:    The following studies were reviewed today:   LEFT HEART CATH AND CORONARY ANGIOGRAPHY  04/29/2017  Conclusion    Prox RCA lesion is 75% stenosed (FFR 0.78). -Site of previous angioplasty in setting of STEMI  A drug-eluting stent was successfully placed using a STENT SYNERGY DES 3X24. Postdilated to 3.34 mm  Post intervention, there is a 0% residual stenosis.  Dist RCA lesion is 45% stenosed.  Ost LAD to Prox LAD lesion is 50% stenosed. Prox LAD lesion is 40% stenosed. Mid-distal LAD lesion is 65% stenosed. While it is possible that these lesions in tandem could be FFR positive, neither lesion alone is a great option for beneficial PCI.  There is mild left ventricular systolic dysfunction -with basal to mid inferior hypokinesis..  The left ventricular ejection fraction is 45-50% by visual estimate.   Severe single-vessel disease involving the previously occluded RCA.  This area was treated with a DES stent.   There is also moderate diffuse disease in the LAD that is probably best treated medically.  Relatively preserved LVEF with minimally elevated LVEDP.  Plan per  discussion with Dr. Tamala Julian will be to treat the LAD lesion medically and reassess if symptoms are not improving.  The patient will be monitored overnight for post cath care and TR band removal.  He will be discharged in the morning if stable.  Dual antiplatelet therapy for minimum of 3-6 months, after which point aspirin can be discontinued.     EKG:  EKG is not ordered today.   Recent Labs: 04/30/2017: BUN 12; Creatinine, Ser 1.09; Hemoglobin 12.6; Platelets 119; Potassium 3.6; Sodium 138   Recent Lipid Panel No results found for: CHOL, TRIG, HDL, CHOLHDL, VLDL, LDLCALC, LDLDIRECT  Physical Exam:    VS:  BP 118/60   Pulse 62   Ht 6' (1.829 m)   Wt 219 lb (99.3 kg)   SpO2 95%   BMI 29.70 kg/m     Wt Readings from Last 3 Encounters:  05/30/17 219 lb (99.3 kg)  05/06/17 218 lb 1.9 oz (98.9 kg)  04/30/17 209 lb 7 oz (95 kg)     Physical Exam  Constitutional: He is oriented to person, place, and time. He appears well-developed and well-nourished. No distress.  HENT:  Head: Normocephalic and atraumatic.  Neck: Normal range of motion. Neck supple. No JVD present. Carotid bruit is not present.  Cardiovascular: Normal rate and regular rhythm. Exam reveals no gallop and no friction rub.  No murmur heard. Pulmonary/Chest: Effort normal and breath sounds normal. No respiratory distress. He has no wheezes. He has no rales.  Abdominal: Soft. Bowel sounds are normal. He exhibits no distension. There is no tenderness.  Musculoskeletal: Normal range of motion. He exhibits no edema or deformity.  Neurological: He is alert and oriented to person, place, and time.  Skin: Skin is warm and dry.  Psychiatric: He has a normal mood and affect. His behavior is normal. Thought content normal.     ASSESSMENT:    1. S/P angioplasty with stent   2. Mixed hyperlipidemia   3. Controlled type 2 diabetes mellitus with diabetic nephropathy, without long-term current use of insulin ([place])   4. OSA  (obstructive sleep apnea)    PLAN:  In order of problems listed above:  1. CAD:  s/p acute anterior STEMI treated with PCI to RCA in 2007 and recent LHC for shortness of breath and fatigue revealing 75% stenosis of pRCA treated with DES by Dr. Ellyn Hack. He also has diffuse disease in the LAD being treated medically. He is on DAPT with aspirin and Plavix as well as statin. Not on BB due to soft BP and marginal heart rate. ARB was stopped to allow room in BP for nitrate. Previous dyspnea on exertion now resolved with addition of Imdur. No chest pain. Is now able to increase his activity level without exertional symptoms.  Pt has mild lightheadedness with position change and I don't think he can tolerate lower BP. Will not add BB at this time. Pt plans to return to exercising again on Monday. I think he is fine to proceed. We discussed starting slowly and watching his BP and drinking water if he has lightheadedness.   2. HLD: Pt on simvastatin low dose of 5 mg and pt often only takes it every other day. He has myalgias related. He says that he had recent lipids checked at his PCP. He is asked to have the results sent to [place]. We discussed an LDL goal of <70 and can refer to the lipid clinic if he is not to goal.  3. DM type 2: Pt is happy that he has lost about 12 pounds and his A1c is down to 6.6. He had some low blood sugars and his meds were recently adjusted by his PCP.   4. OSA: Pt reports using his CPAP consistently.    Medication Adjustments/Labs and Tests Ordered: Current medicines are reviewed at length with the patient today.  Concerns regarding medicines are outlined above. Labs and tests ordered and medication changes are outlined in the patient instructions below:  Patient Instructions  Medication Instructions:  Your physician recommends that you continue on your current medications as directed. Please refer to the Current Medication list given to you today.   Labwork: None  ordered  Testing/Procedures: None ordered  Follow-Up: Your physician recommends that you schedule a follow-up appointment in: 3-4 MONTHS WITH DR. Tamala Julian   Any Other Special Instructions Will Be Listed Below (If Applicable).     If you need a refill on your cardiac medications before your next appointment, please call your pharmacy.      Signed, Daune Perch, NP  05/30/2017 11:13 AM    [place]

## 2017-05-28 ENCOUNTER — Encounter: Payer: Self-pay | Admitting: Cardiology

## 2017-05-30 ENCOUNTER — Encounter: Payer: Self-pay | Admitting: Cardiology

## 2017-05-30 ENCOUNTER — Ambulatory Visit: Payer: Medicare Other | Admitting: Cardiology

## 2017-05-30 VITALS — BP 118/60 | HR 62 | Ht 72.0 in | Wt 219.0 lb

## 2017-05-30 DIAGNOSIS — G4733 Obstructive sleep apnea (adult) (pediatric): Secondary | ICD-10-CM

## 2017-05-30 DIAGNOSIS — E782 Mixed hyperlipidemia: Secondary | ICD-10-CM

## 2017-05-30 DIAGNOSIS — Z959 Presence of cardiac and vascular implant and graft, unspecified: Secondary | ICD-10-CM

## 2017-05-30 DIAGNOSIS — E1121 Type 2 diabetes mellitus with diabetic nephropathy: Secondary | ICD-10-CM

## 2017-05-30 DIAGNOSIS — Z95820 Peripheral vascular angioplasty status with implants and grafts: Secondary | ICD-10-CM

## 2017-05-30 NOTE — Patient Instructions (Addendum)
Medication Instructions:  Your physician recommends that you continue on your current medications as directed. Please refer to the Current Medication list given to you today.   Labwork: None ordered  Testing/Procedures: None ordered  Follow-Up: Your physician recommends that you schedule a follow-up appointment in: 3-4 MONTHS WITH DR. Tamala Julian   Any Other Special Instructions Will Be Listed Below (If Applicable).     If you need a refill on your cardiac medications before your next appointment, please call your pharmacy.

## 2017-06-04 DIAGNOSIS — N400 Enlarged prostate without lower urinary tract symptoms: Secondary | ICD-10-CM | POA: Diagnosis not present

## 2017-06-18 DIAGNOSIS — Z9861 Coronary angioplasty status: Secondary | ICD-10-CM | POA: Diagnosis not present

## 2017-06-23 DIAGNOSIS — Z9861 Coronary angioplasty status: Secondary | ICD-10-CM | POA: Diagnosis not present

## 2017-06-25 DIAGNOSIS — Z9861 Coronary angioplasty status: Secondary | ICD-10-CM | POA: Diagnosis not present

## 2017-06-26 DIAGNOSIS — Z9861 Coronary angioplasty status: Secondary | ICD-10-CM | POA: Diagnosis not present

## 2017-06-30 DIAGNOSIS — Z9861 Coronary angioplasty status: Secondary | ICD-10-CM | POA: Diagnosis not present

## 2017-07-02 DIAGNOSIS — Z9861 Coronary angioplasty status: Secondary | ICD-10-CM | POA: Diagnosis not present

## 2017-07-03 DIAGNOSIS — Z9861 Coronary angioplasty status: Secondary | ICD-10-CM | POA: Diagnosis not present

## 2017-07-07 DIAGNOSIS — Z9861 Coronary angioplasty status: Secondary | ICD-10-CM | POA: Diagnosis not present

## 2017-07-09 DIAGNOSIS — Z9861 Coronary angioplasty status: Secondary | ICD-10-CM | POA: Diagnosis not present

## 2017-07-10 DIAGNOSIS — Z9861 Coronary angioplasty status: Secondary | ICD-10-CM | POA: Diagnosis not present

## 2017-07-14 DIAGNOSIS — Z9861 Coronary angioplasty status: Secondary | ICD-10-CM | POA: Diagnosis not present

## 2017-07-16 DIAGNOSIS — Z9861 Coronary angioplasty status: Secondary | ICD-10-CM | POA: Diagnosis not present

## 2017-07-17 DIAGNOSIS — Z9861 Coronary angioplasty status: Secondary | ICD-10-CM | POA: Diagnosis not present

## 2017-07-21 DIAGNOSIS — Z9861 Coronary angioplasty status: Secondary | ICD-10-CM | POA: Diagnosis not present

## 2017-07-23 DIAGNOSIS — Z9861 Coronary angioplasty status: Secondary | ICD-10-CM | POA: Diagnosis not present

## 2017-07-24 DIAGNOSIS — Z9861 Coronary angioplasty status: Secondary | ICD-10-CM | POA: Diagnosis not present

## 2017-07-28 DIAGNOSIS — Z9861 Coronary angioplasty status: Secondary | ICD-10-CM | POA: Diagnosis not present

## 2017-07-30 DIAGNOSIS — Z9861 Coronary angioplasty status: Secondary | ICD-10-CM | POA: Diagnosis not present

## 2017-07-31 ENCOUNTER — Telehealth: Payer: Self-pay | Admitting: Interventional Cardiology

## 2017-07-31 DIAGNOSIS — Z9861 Coronary angioplasty status: Secondary | ICD-10-CM | POA: Diagnosis not present

## 2017-07-31 NOTE — Telephone Encounter (Signed)
Amy calling from Cardiac Rehab, pt stated he had left sided chest pain while shoveling snow a while back, rested and it went away no Nitro was taken. Pt states we are aware of him having chest pain, however Amy read Gae Bon Hammond's 12-14 note specifically states pt denies chest pain.Has not been seen since. His next appt is with Dr. Tamala Julian 09-05-17. Amy would like pt to be called to advise if he needs sooner appt

## 2017-07-31 NOTE — Telephone Encounter (Signed)
Spoke with pt and he states that back when we had snow he was out shoveling and had a soreness, not really a pain, on the left side of his chest.  He said it was just a little "blip".  Resolved as soon as he stopped shoveling.  Pt states this happened again yesterday while he was shoveling dirt.  Soreness stopped immediately as soon as he stopped shoveling again.  BP has been fine and pt feels great.  Denies any issues today.  Advised pt to keep appt with Dr. Tamala Julian on 3/22 and I would send message to see if Dr. Tamala Julian felt he needed to be seen sooner.  Pt aware to call for sooner appt if these episodes become more frequent.

## 2017-08-03 NOTE — Telephone Encounter (Signed)
Does not sound cardiac. May be neck or shoulder problem.

## 2017-08-04 DIAGNOSIS — Z9861 Coronary angioplasty status: Secondary | ICD-10-CM | POA: Diagnosis not present

## 2017-08-06 DIAGNOSIS — Z9861 Coronary angioplasty status: Secondary | ICD-10-CM | POA: Diagnosis not present

## 2017-08-07 DIAGNOSIS — Z9861 Coronary angioplasty status: Secondary | ICD-10-CM | POA: Diagnosis not present

## 2017-08-08 ENCOUNTER — Other Ambulatory Visit: Payer: Self-pay | Admitting: *Deleted

## 2017-08-08 MED ORDER — PANTOPRAZOLE SODIUM 40 MG PO TBEC: 40.0000 mg | DELAYED_RELEASE_TABLET | Freq: Every day | ORAL | 9 refills | Status: AC

## 2017-08-08 NOTE — Telephone Encounter (Signed)
That's fine. Thanks!

## 2017-08-08 NOTE — Telephone Encounter (Signed)
Patient called and requested a refill on pantoprazole from Dr Tamala Julian. Okay to refill? Thanks, MI

## 2017-08-11 DIAGNOSIS — Z9861 Coronary angioplasty status: Secondary | ICD-10-CM | POA: Diagnosis not present

## 2017-08-13 DIAGNOSIS — Z9861 Coronary angioplasty status: Secondary | ICD-10-CM | POA: Diagnosis not present

## 2017-08-14 DIAGNOSIS — Z9861 Coronary angioplasty status: Secondary | ICD-10-CM | POA: Diagnosis not present

## 2017-08-18 DIAGNOSIS — Z9861 Coronary angioplasty status: Secondary | ICD-10-CM | POA: Diagnosis not present

## 2017-08-20 DIAGNOSIS — Z9861 Coronary angioplasty status: Secondary | ICD-10-CM | POA: Diagnosis not present

## 2017-08-21 DIAGNOSIS — Z9861 Coronary angioplasty status: Secondary | ICD-10-CM | POA: Diagnosis not present

## 2017-08-23 ENCOUNTER — Other Ambulatory Visit: Payer: Self-pay | Admitting: Cardiology

## 2017-08-25 DIAGNOSIS — Z9861 Coronary angioplasty status: Secondary | ICD-10-CM | POA: Diagnosis not present

## 2017-08-25 DIAGNOSIS — E782 Mixed hyperlipidemia: Secondary | ICD-10-CM | POA: Diagnosis not present

## 2017-08-25 DIAGNOSIS — E1322 Other specified diabetes mellitus with diabetic chronic kidney disease: Secondary | ICD-10-CM | POA: Diagnosis not present

## 2017-08-27 DIAGNOSIS — Z9861 Coronary angioplasty status: Secondary | ICD-10-CM | POA: Diagnosis not present

## 2017-08-28 DIAGNOSIS — Z9861 Coronary angioplasty status: Secondary | ICD-10-CM | POA: Diagnosis not present

## 2017-09-04 NOTE — Progress Notes (Signed)
Cardiology Office Note    Date:  09/05/2017   ID:  Eric Carter, DOB 1931-11-12, MRN [id number]  PCP:  Isaias Sakai, DO  Cardiologist: Sinclair Grooms, MD   Chief Complaint  Patient presents with  . Coronary Artery Disease    History of Present Illness:  Eric Carter is a 84 y.o. male with a past medical history significant for OSA on CPAP, HTN, HLD, DM and CAD s/p acute anterior STEMI treated with PCI to RCA in 2007 and recent LHC for shortness of breath and fatigue revealing 75% stenosis of pRCA treated with DES by Dr. Ellyn Hack. He also has diffuse disease in the LAD being treated medically. He is on DAPT with aspirin and Plavix as well as statin, ARB and diabetes meds.   Eric Carter had an ischemic event in November 2018 at which time a drug-eluting stent was placed in the proximal RCA by Dr. Ellyn Hack.  He was having vague symptoms leading to diagnostic catheterization which ultimately found to hemodynamically significant RCA stenosis.  Patient has been in phase 2 cardiac rehab in [place].  He has been progressing well and not had difficulty.  History of exertional fatigue persists.  No chest discomfort.  Noted to have relatively slow heart rate.   Past Medical History:  Diagnosis Date  . Arthritis    "all over" (04/29/2017)  . BPH (benign prostatic hyperplasia)   . Chronic constipation   . Coronary artery disease cardiologist-  dr Daneen Schick   hx STEMI inferior  in 2007, distla RCA angioplasty  . First degree heart block   . GERD (gastroesophageal reflux disease)   . History of atrial fibrillation without current medication    episode Atrial Fib. during cardiac cath. 02-28-2006 resolved before discharge  . History of urinary retention    08/2010;  08/2016  . Hyperlipidemia   . Hypertension   . OSA on CPAP    per sleetp study 10-08-2015 Moderate with AHI 19/hr now on CPAP  . RBBB (right bundle branch block)   . Skin cancer    "nose; left ear X 2"  (04/29/2017)  . STEMI (ST elevation myocardial infarction) ([place]) 02/28/2006    inferior STEMI  s/p  RCA angioplasty  . Type II diabetes mellitus ([place])   . Wears glasses     Past Surgical History:  Procedure Laterality Date  . CARDIAC CATHETERIZATION  02-28-2006  dr Daneen Schick   PCI distal RCA /  mLAD  50%,  inferior akinesis, develpment of atrial fibrillation during case,  ef 50%  . CARDIOVASCULAR STRESS TEST  09/20/2002   normal nuclear study w/ no ischemia (diaphragmatic attenuation of the inferior wall)/  normal LV function and wall motion, ef 72%  . CATARACT EXTRACTION W/ INTRAOCULAR LENS  IMPLANT, BILATERAL Bilateral 2015  . COLONOSCOPY  last one 07-02-2013  . CORONARY ANGIOPLASTY WITH STENT PLACEMENT    . CORONARY STENT INTERVENTION N/A 04/29/2017   Procedure: CORONARY STENT INTERVENTION;  Surgeon: Leonie Man, MD;  Location: [facility];  Service: Cardiovascular;  Laterality: N/A;  . INTRAVASCULAR PRESSURE WIRE/FFR STUDY N/A 04/29/2017   Procedure: INTRAVASCULAR PRESSURE WIRE/FFR STUDY;  Surgeon: Leonie Man, MD;  Location: [facility];  Service: Cardiovascular;  Laterality: N/A;  . LEFT HEART CATH AND CORONARY ANGIOGRAPHY N/A 04/29/2017   Procedure: LEFT HEART CATH AND CORONARY ANGIOGRAPHY;  Surgeon: Leonie Man, MD;  Location: [facility];  Service: Cardiovascular;  Laterality:  N/A;  . SKIN CANCER EXCISION     "nose"  . THULIUM LASER TURP (TRANSURETHRAL RESECTION OF PROSTATE) N/A 10/07/2016   Procedure: THULIUM LASER TURP (TRANSURETHRAL RESECTION OF PROSTATE);  Surgeon: Carolan Clines, MD;  Location: [facility];  Service: Urology;  Laterality: N/A;  . TONSILLECTOMY  child  . TRANSURETHRAL RESECTION OF PROSTATE N/A 11/07/2016   Procedure: SECOND STAGE TRANSURETHRAL RESECTION OF THE PROSTATE (TURP);  Surgeon: Carolan Clines, MD;  Location: [facility];  Service: Urology;  Laterality: N/A;    Current Medications: Outpatient  Medications Prior to Visit  Medication Sig Dispense Refill  . aspirin EC 81 MG tablet Take 1 tablet (81 mg total) daily by mouth. 90 tablet 3  . clopidogrel (PLAVIX) 75 MG tablet Take 1 tablet (75 mg total) daily with breakfast by mouth. 90 tablet 2  . Dulaglutide (TRULICITY [place]) Use as directed weekly injection.    . isosorbide mononitrate (IMDUR) 30 MG 24 hr tablet TAKE 1 TABLET BY MOUTH EVERY DAY 30 tablet 7  . metFORMIN (GLUCOPHAGE) 500 MG tablet Take 500-1,000 mg 2 (two) times daily by mouth. Take 1 tablet (500 mg) in the morning & 2 tablets (1000 mg) by mouth at night.    . nitroGLYCERIN (NITROSTAT) 0.4 MG SL tablet Place 1 tablet (0.4 mg total) under the tongue every 5 (five) minutes as needed for chest pain. 25 tablet 3  . pantoprazole (PROTONIX) 40 MG tablet Take 1 tablet (40 mg total) by mouth at bedtime. 30 tablet 9  . simvastatin (ZOCOR) 5 MG tablet Take 5 mg by mouth every other day. Takes in the evening     No facility-administered medications prior to visit.      Allergies:   Lisinopril; Codeine; Pravastatin sodium; and Simvastatin   Social History   Socioeconomic History  . Marital status: Married    Spouse name: Not on file  . Number of children: Not on file  . Years of education: Not on file  . Highest education level: Not on file  Occupational History  . Not on file  Social Needs  . Financial resource strain: Not on file  . Food insecurity:    Worry: Not on file    Inability: Not on file  . Transportation needs:    Medical: Not on file    Non-medical: Not on file  Tobacco Use  . Smoking status: Former Smoker    Types: Cigars    Last attempt to quit: 10/02/1970    Years since quitting: 46.9  . Smokeless tobacco: Current User    Types: Chew  . Tobacco comment: per pt occasionally chews tobacco  Substance and Sexual Activity  . Alcohol use: Yes    Comment: RARELY   . Drug use: No  . Sexual activity: Not on file  Lifestyle  . Physical activity:    Days  per week: Not on file    Minutes per session: Not on file  . Stress: Not on file  Relationships  . Social connections:    Talks on phone: Not on file    Gets together: Not on file    Attends religious service: Not on file    Active member of club or organization: Not on file    Attends meetings of clubs or organizations: Not on file    Relationship status: Not on file  Other Topics Concern  . Not on file  Social History Narrative  . Not on file     Family History:  The patient's family history includes Colon cancer in his father.   ROS:   Please see the history of present illness.    Difficulty with balance and stability when ambulating. All other systems reviewed and are negative.   PHYSICAL EXAM:   VS:  BP 110/62   Pulse (!) 47   Ht 6' (1.829 m)   Wt 224 lb (101.6 kg)   BMI 30.38 kg/m    GEN: Well nourished, well developed, in no acute distress  HEENT: normal  Neck: no JVD, carotid bruits, or masses Cardiac: RRR; no murmurs, rubs, or gallops,no edema  Respiratory:  clear to auscultation bilaterally, normal work of breathing GI: soft, nontender, nondistended, + BS MS: no deformity or atrophy  Skin: warm and dry, no rash Neuro:  Alert and Oriented x 3, Strength and sensation are intact Psych: euthymic mood, full affect  Wt Readings from Last 3 Encounters:  09/05/17 224 lb (101.6 kg)  05/30/17 219 lb (99.3 kg)  05/06/17 218 lb 1.9 oz (98.9 kg)      Studies/Labs Reviewed:   EKG:  EKG  Not repeated  Recent Labs: 04/30/2017: BUN 12; Creatinine, Ser 1.09; Hemoglobin 12.6; Platelets 119; Potassium 3.6; Sodium 138   Lipid Panel No results found for: CHOL, TRIG, HDL, CHOLHDL, VLDL, LDLCALC, LDLDIRECT  Additional studies/ records that were reviewed today include:  Cardiac catheterization with PCI April 29, 2017:  Coronary Diagrams   Diagnostic Diagram       Post-Intervention Diagram          ASSESSMENT:    1. Coronary artery disease of native  artery of native heart with stable angina pectoris ([place])   2. Bradycardia   3. Mixed hyperlipidemia   4. Essential hypertension, benign   5. OSA (obstructive sleep apnea)      PLAN:  In order of problems listed above:  1. Stable with recent drug-eluting stent to right coronary proximal segment.  Previously had dilatation of distal RCA doing STEMI in 2007. 2. Bradycardia with prior history of paroxysmal A. fib.  He is asymptomatic but perhaps chronotropic incompetence has something to do with exertional instability.  Plan 48-hour Holter monitor to exclude high-grade AV block or chronotropic incompetence. 3. LDL target less than 70.  Most recent LDL was 109.  He is intolerant of statins.  He is only on simvastatin every other day.  Perhaps he needs to be seen in the lipid clinic. 4. Blood pressure is excellently controlled.  Target 130/80 mmHg. 5. Not discussed  Clinical follow-up in 6 months.  Continue current medical therapy.  48-hour Holter to rule out chronotropic incompetence.  No change in current therapy other than beginning discussions concerning an adequate LDL control (target less than 70).  Consider switching simvastatin to rosuvastatin 5 mg Monday Wednesday Friday.    Medication Adjustments/Labs and Tests Ordered: Current medicines are reviewed at length with the patient today.  Concerns regarding medicines are outlined above.  Medication changes, Labs and Tests ordered today are listed in the Patient Instructions below. Patient Instructions  Medication Instructions:  Your physician recommends that you continue on your current medications as directed. Please refer to the Current Medication list given to you today.  Labwork: None  Testing/Procedures: Your physician has recommended that you wear a 48 hour holter monitor. Holter monitors are medical devices that record the heart's electrical activity. Doctors most often use these monitors to diagnose arrhythmias. Arrhythmias are  problems with the speed or rhythm of the heartbeat. The monitor is  a small, portable device. You can wear one while you do your normal daily activities. This is usually used to diagnose what is causing palpitations/syncope (passing out).    Follow-Up: Your physician wants you to follow-up in: 6 months with Dr. Tamala Julian.  You will receive a reminder letter in the mail two months in advance. If you don't receive a letter, please call our office to schedule the follow-up appointment.   Any Other Special Instructions Will Be Listed Below (If Applicable).     If you need a refill on your cardiac medications before your next appointment, please call your pharmacy.      Signed, Sinclair Grooms, MD  09/05/2017 10:29 AM    [address], [place], [place]  [address] Phone: [phone]; Fax: [phone]

## 2017-09-05 ENCOUNTER — Encounter: Payer: Self-pay | Admitting: Interventional Cardiology

## 2017-09-05 ENCOUNTER — Ambulatory Visit: Payer: Medicare Other | Admitting: Interventional Cardiology

## 2017-09-05 ENCOUNTER — Encounter (INDEPENDENT_AMBULATORY_CARE_PROVIDER_SITE_OTHER): Payer: Self-pay

## 2017-09-05 VITALS — BP 110/62 | HR 47 | Ht 72.0 in | Wt 224.0 lb

## 2017-09-05 DIAGNOSIS — R001 Bradycardia, unspecified: Secondary | ICD-10-CM

## 2017-09-05 DIAGNOSIS — E782 Mixed hyperlipidemia: Secondary | ICD-10-CM | POA: Diagnosis not present

## 2017-09-05 DIAGNOSIS — I10 Essential (primary) hypertension: Secondary | ICD-10-CM

## 2017-09-05 DIAGNOSIS — G4733 Obstructive sleep apnea (adult) (pediatric): Secondary | ICD-10-CM

## 2017-09-05 DIAGNOSIS — I25118 Atherosclerotic heart disease of native coronary artery with other forms of angina pectoris: Secondary | ICD-10-CM

## 2017-09-05 NOTE — Patient Instructions (Signed)
Medication Instructions:  Your physician recommends that you continue on your current medications as directed. Please refer to the Current Medication list given to you today.  Labwork: None  Testing/Procedures: Your physician has recommended that you wear a 48 hour holter monitor. Holter monitors are medical devices that record the heart's electrical activity. Doctors most often use these monitors to diagnose arrhythmias. Arrhythmias are problems with the speed or rhythm of the heartbeat. The monitor is a small, portable device. You can wear one while you do your normal daily activities. This is usually used to diagnose what is causing palpitations/syncope (passing out).    Follow-Up: Your physician wants you to follow-up in: 6 months with Dr. Tamala Julian.  You will receive a reminder letter in the mail two months in advance. If you don't receive a letter, please call our office to schedule the follow-up appointment.   Any Other Special Instructions Will Be Listed Below (If Applicable).     If you need a refill on your cardiac medications before your next appointment, please call your pharmacy.

## 2017-09-08 DIAGNOSIS — Z9861 Coronary angioplasty status: Secondary | ICD-10-CM | POA: Diagnosis not present

## 2017-09-10 DIAGNOSIS — Z9861 Coronary angioplasty status: Secondary | ICD-10-CM | POA: Diagnosis not present

## 2017-09-11 DIAGNOSIS — Z9861 Coronary angioplasty status: Secondary | ICD-10-CM | POA: Diagnosis not present

## 2017-09-15 DIAGNOSIS — Z9861 Coronary angioplasty status: Secondary | ICD-10-CM | POA: Diagnosis not present

## 2017-09-17 DIAGNOSIS — Z9861 Coronary angioplasty status: Secondary | ICD-10-CM | POA: Diagnosis not present

## 2017-09-19 ENCOUNTER — Ambulatory Visit (INDEPENDENT_AMBULATORY_CARE_PROVIDER_SITE_OTHER): Payer: Medicare Other

## 2017-09-19 DIAGNOSIS — R001 Bradycardia, unspecified: Secondary | ICD-10-CM | POA: Diagnosis not present

## 2017-09-19 DIAGNOSIS — E1122 Type 2 diabetes mellitus with diabetic chronic kidney disease: Secondary | ICD-10-CM | POA: Diagnosis not present

## 2017-09-19 DIAGNOSIS — Z1331 Encounter for screening for depression: Secondary | ICD-10-CM | POA: Diagnosis not present

## 2017-11-25 DIAGNOSIS — E1122 Type 2 diabetes mellitus with diabetic chronic kidney disease: Secondary | ICD-10-CM | POA: Diagnosis not present

## 2018-01-01 DIAGNOSIS — Z961 Presence of intraocular lens: Secondary | ICD-10-CM | POA: Diagnosis not present

## 2018-01-01 DIAGNOSIS — E119 Type 2 diabetes mellitus without complications: Secondary | ICD-10-CM | POA: Diagnosis not present

## 2018-01-21 ENCOUNTER — Other Ambulatory Visit: Payer: Self-pay | Admitting: Cardiology

## 2018-01-21 NOTE — Telephone Encounter (Signed)
Outpatient Medication Detail    Disp Refills Start End   isosorbide mononitrate (IMDUR) 30 MG 24 hr tablet 30 tablet 7 08/25/2017    Sig: TAKE 1 TABLET BY MOUTH EVERY DAY   Sent to pharmacy as: isosorbide mononitrate (IMDUR) 30 MG 24 hr tablet   E-Prescribing Status: Receipt confirmed by pharmacy (08/25/2017 3:35 PM EDT)   Pharmacy   [id number] - [place], [place] [address]

## 2018-01-26 ENCOUNTER — Encounter: Payer: Self-pay | Admitting: Cardiology

## 2018-01-26 ENCOUNTER — Ambulatory Visit: Payer: Medicare Other | Admitting: Cardiology

## 2018-01-26 VITALS — BP 120/62 | HR 62 | Ht 72.0 in | Wt 226.2 lb

## 2018-01-26 DIAGNOSIS — G4733 Obstructive sleep apnea (adult) (pediatric): Secondary | ICD-10-CM

## 2018-01-26 DIAGNOSIS — E669 Obesity, unspecified: Secondary | ICD-10-CM | POA: Diagnosis not present

## 2018-01-26 DIAGNOSIS — I10 Essential (primary) hypertension: Secondary | ICD-10-CM | POA: Diagnosis not present

## 2018-01-26 NOTE — Progress Notes (Signed)
Cardiology Office Note:    Date:  01/26/2018   ID:  Eric Carter, DOB April 29, 1932, MRN [id number]  PCP:  Isaias Sakai, DO  Cardiologist:  Sinclair Grooms, MD    Referring MD: Alonna Buckler*   Chief Complaint  Patient presents with  . Sleep Apnea  . Hypertension    History of Present Illness:    Eric Carter is a 84 y.o. male with a hx of  moderate OSA with an AHI of 19.8/hr on PSG and is on CPAP at 12cm H2O.  He is doing well with his CPAP device.  He tolerates the mask and feels the pressure is adequate.  Since going on CPAP He feels rested in the am and has no significant daytime sleepiness.  He denies any significant mouth or nasal dryness or nasal congestion.  He does not think that he snores.     Past Medical History:  Diagnosis Date  . Arthritis    "all over" (04/29/2017)  . BPH (benign prostatic hyperplasia)   . Chronic constipation   . Coronary artery disease cardiologist-  dr Daneen Schick   hx STEMI inferior  in 2007, distla RCA angioplasty  . First degree heart block   . GERD (gastroesophageal reflux disease)   . History of atrial fibrillation without current medication    episode Atrial Fib. during cardiac cath. 02-28-2006 resolved before discharge  . History of urinary retention    08/2010;  08/2016  . Hyperlipidemia   . Hypertension   . OSA on CPAP    per sleetp study 10-08-2015 Moderate with AHI 19/hr now on CPAP  . RBBB (right bundle branch block)   . Skin cancer    "nose; left ear X 2" (04/29/2017)  . STEMI (ST elevation myocardial infarction) ([place]) 02/28/2006    inferior STEMI  s/p  RCA angioplasty  . Type II diabetes mellitus ([place])   . Wears glasses     Past Surgical History:  Procedure Laterality Date  . CARDIAC CATHETERIZATION  02-28-2006  dr Daneen Schick   PCI distal RCA /  mLAD  50%,  inferior akinesis, develpment of atrial fibrillation during case,  ef 50%  . CARDIOVASCULAR STRESS TEST  09/20/2002   normal nuclear  study w/ no ischemia (diaphragmatic attenuation of the inferior wall)/  normal LV function and wall motion, ef 72%  . CATARACT EXTRACTION W/ INTRAOCULAR LENS  IMPLANT, BILATERAL Bilateral 2015  . COLONOSCOPY  last one 07-02-2013  . CORONARY ANGIOPLASTY WITH STENT PLACEMENT    . CORONARY STENT INTERVENTION N/A 04/29/2017   Procedure: CORONARY STENT INTERVENTION;  Surgeon: Leonie Man, MD;  Location: [facility];  Service: Cardiovascular;  Laterality: N/A;  . INTRAVASCULAR PRESSURE WIRE/FFR STUDY N/A 04/29/2017   Procedure: INTRAVASCULAR PRESSURE WIRE/FFR STUDY;  Surgeon: Leonie Man, MD;  Location: [facility];  Service: Cardiovascular;  Laterality: N/A;  . LEFT HEART CATH AND CORONARY ANGIOGRAPHY N/A 04/29/2017   Procedure: LEFT HEART CATH AND CORONARY ANGIOGRAPHY;  Surgeon: Leonie Man, MD;  Location: [facility];  Service: Cardiovascular;  Laterality: N/A;  . SKIN CANCER EXCISION     "nose"  . THULIUM LASER TURP (TRANSURETHRAL RESECTION OF PROSTATE) N/A 10/07/2016   Procedure: THULIUM LASER TURP (TRANSURETHRAL RESECTION OF PROSTATE);  Surgeon: Carolan Clines, MD;  Location: [facility];  Service: Urology;  Laterality: N/A;  . TONSILLECTOMY  child  . TRANSURETHRAL RESECTION OF PROSTATE N/A 11/07/2016   Procedure:  SECOND STAGE TRANSURETHRAL RESECTION OF THE PROSTATE (TURP);  Surgeon: Carolan Clines, MD;  Location: [facility];  Service: Urology;  Laterality: N/A;    Current Medications: Current Meds  Medication Sig  . aspirin EC 81 MG tablet Take 1 tablet (81 mg total) daily by mouth.  . clopidogrel (PLAVIX) 75 MG tablet Take 1 tablet (75 mg total) daily with breakfast by mouth.  . Dulaglutide (TRULICITY [place]) Use as directed weekly injection.  Marland Kitchen glimepiride (AMARYL) 4 MG tablet Take 1 tablet by mouth 2 (two) times daily as needed.  . isosorbide mononitrate (IMDUR) 30 MG 24 hr tablet TAKE 1 TABLET BY MOUTH EVERY DAY  . metFORMIN  (GLUCOPHAGE) 500 MG tablet Take 500-1,000 mg 2 (two) times daily by mouth. Take 1 tablet (500 mg) in the morning & 2 tablets (1000 mg) by mouth at night.  . nitroGLYCERIN (NITROSTAT) 0.4 MG SL tablet Place 1 tablet (0.4 mg total) under the tongue every 5 (five) minutes as needed for chest pain.  . pantoprazole (PROTONIX) 40 MG tablet Take 1 tablet (40 mg total) by mouth at bedtime.  . pioglitazone (ACTOS) 30 MG tablet Take 1 tablet by mouth daily.  . simvastatin (ZOCOR) 5 MG tablet Take 5 mg by mouth every other day. Takes in the evening     Allergies:   Lisinopril; Codeine; Pravastatin sodium; and Simvastatin   Social History   Socioeconomic History  . Marital status: Married    Spouse name: Not on file  . Number of children: Not on file  . Years of education: Not on file  . Highest education level: Not on file  Occupational History  . Not on file  Social Needs  . Financial resource strain: Not on file  . Food insecurity:    Worry: Not on file    Inability: Not on file  . Transportation needs:    Medical: Not on file    Non-medical: Not on file  Tobacco Use  . Smoking status: Former Smoker    Types: Cigars    Last attempt to quit: 10/02/1970    Years since quitting: 47.3  . Smokeless tobacco: Current User    Types: Chew  . Tobacco comment: per pt occasionally chews tobacco  Substance and Sexual Activity  . Alcohol use: Yes    Comment: RARELY   . Drug use: No  . Sexual activity: Not on file  Lifestyle  . Physical activity:    Days per week: Not on file    Minutes per session: Not on file  . Stress: Not on file  Relationships  . Social connections:    Talks on phone: Not on file    Gets together: Not on file    Attends religious service: Not on file    Active member of club or organization: Not on file    Attends meetings of clubs or organizations: Not on file    Relationship status: Not on file  Other Topics Concern  . Not on file  Social History Narrative  .  Not on file     Family History: The patient's family history includes Colon cancer in his father. There is no history of Stomach cancer.  ROS:   Please see the history of present illness.    ROS  All other systems reviewed and negative.   EKGs/Labs/Other Studies Reviewed:    The following studies were reviewed today: PAP download  EKG:  EKG is not ordered today.   Recent Labs: 04/30/2017: BUN 12;  Creatinine, Ser 1.09; Hemoglobin 12.6; Platelets 119; Potassium 3.6; Sodium 138   Recent Lipid Panel No results found for: CHOL, TRIG, HDL, CHOLHDL, VLDL, LDLCALC, LDLDIRECT  Physical Exam:    VS:  BP 120/62   Pulse 62   Ht 6' (1.829 m)   Wt 226 lb 3.2 oz (102.6 kg)   SpO2 92%   BMI 30.68 kg/m     Wt Readings from Last 3 Encounters:  01/26/18 226 lb 3.2 oz (102.6 kg)  09/05/17 224 lb (101.6 kg)  05/30/17 219 lb (99.3 kg)     GEN:  Well nourished, well developed in no acute distress HEENT: Normal NECK: No JVD; No carotid bruits LYMPHATICS: No lymphadenopathy CARDIAC: RRR, no murmurs, rubs, gallops RESPIRATORY:  Clear to auscultation without rales, wheezing or rhonchi  ABDOMEN: Soft, non-tender, non-distended MUSCULOSKELETAL:  No edema; No deformity  SKIN: Warm and dry NEUROLOGIC:  Alert and oriented x 3 PSYCHIATRIC:  Normal affect   ASSESSMENT:    1. OSA (obstructive sleep apnea)   2. Essential hypertension, benign   3. Obesity (BMI 30.0-34.9)    PLAN:    In order of problems listed above:  1.  OSA - the patient is tolerating PAP therapy well without any problems. The PAP download was reviewed today and showed an AHI of 3.4/hr on 13 cm H2O with 93% compliance in using more than 4 hours nightly.  The patient has been using and benefiting from PAP use and will continue to benefit from therapy.   2.  Hypertension -he is well controlled on exam today.  He is not on any antihypertensive medication currently.  3.  Obesity - I have encouraged him to get into a  routine exercise program and cut back on carbs and portions.    Medication Adjustments/Labs and Tests Ordered: Current medicines are reviewed at length with the patient today.  Concerns regarding medicines are outlined above.  No orders of the defined types were placed in this encounter.  No orders of the defined types were placed in this encounter.   Signed, Fransico Him, MD  01/26/2018 3:45 PM    [place]

## 2018-01-26 NOTE — Patient Instructions (Signed)
Medication Instructions:  Your physician recommends that you continue on your current medications as directed. Please refer to the Current Medication list given to you today.  Follow-Up: Your physician wants you to follow-up in: 1 year with Dr. Radford Pax. You will receive a reminder letter in the mail two months in advance. If you don't receive a letter, please call our office to schedule the follow-up appointment.   If you need a refill on your cardiac medications before your next appointment, please call your pharmacy.

## 2018-01-30 DIAGNOSIS — D2262 Melanocytic nevi of left upper limb, including shoulder: Secondary | ICD-10-CM | POA: Diagnosis not present

## 2018-01-30 DIAGNOSIS — Z85828 Personal history of other malignant neoplasm of skin: Secondary | ICD-10-CM | POA: Diagnosis not present

## 2018-01-30 DIAGNOSIS — L570 Actinic keratosis: Secondary | ICD-10-CM | POA: Diagnosis not present

## 2018-01-30 DIAGNOSIS — D225 Melanocytic nevi of trunk: Secondary | ICD-10-CM | POA: Diagnosis not present

## 2018-01-30 DIAGNOSIS — D692 Other nonthrombocytopenic purpura: Secondary | ICD-10-CM | POA: Diagnosis not present

## 2018-01-30 DIAGNOSIS — D2272 Melanocytic nevi of left lower limb, including hip: Secondary | ICD-10-CM | POA: Diagnosis not present

## 2018-02-02 ENCOUNTER — Other Ambulatory Visit: Payer: Self-pay | Admitting: Cardiology

## 2018-02-24 DIAGNOSIS — N183 Chronic kidney disease, stage 3 (moderate): Secondary | ICD-10-CM | POA: Diagnosis not present

## 2018-02-24 DIAGNOSIS — E1143 Type 2 diabetes mellitus with diabetic autonomic (poly)neuropathy: Secondary | ICD-10-CM | POA: Diagnosis not present

## 2018-02-24 DIAGNOSIS — E1122 Type 2 diabetes mellitus with diabetic chronic kidney disease: Secondary | ICD-10-CM | POA: Diagnosis not present

## 2018-02-24 DIAGNOSIS — I25118 Atherosclerotic heart disease of native coronary artery with other forms of angina pectoris: Secondary | ICD-10-CM | POA: Diagnosis not present

## 2018-03-03 NOTE — Progress Notes (Signed)
CARDIOLOGY OFFICE NOTE  Date:  03/04/2018    Eric Carter Date of Birth: 07/17/1931 Medical Record #[id number]  PCP:  Isaias Sakai, DO  Cardiologist:  Tamala Julian   Chief Complaint  Patient presents with  . Coronary Artery Disease    6 month check - seen for Dr. Tamala Julian    History of Present Illness: Eric Carter is a 84 y.o. male who presents today for a 6 month check. Seen for Dr. Tamala Julian - sees Dr. Radford Pax for OSA/CPAP.   He has a history of OSA on CPAP, HTN, HLD, DM and CAD s/p acute anterior STEMI treated with PCI to RCA in 2007 with last LHC in November of 2018 for shortness of breath and fatigue revealing 75% stenosis of pRCA treated with DES by Dr. Ellyn Hack. He also has diffuse disease inthe LAD being treated medically. He is on DAPT with aspirin and Plavix as well as statin, ARB and diabetes meds.   Last seen by Dr. Tamala Julian in March of 2019 and was doing well. HR was slow. His Holter however showed average HR of 60.   Comes in today. Here alone. He feels like he is doing well. No chest pain. Says his breathing is ok. He has had issues with chronic fatigue but remains pretty active. Has a 7 acre farm and a place at [employer] - these keep him busy. He uses the treadmill for 30 minutes at least 3 times a week. No syncope. Some dizziness if he gets up too fast. He notes his A1C has improved with change in his diabetic regimen.   Past Medical History:  Diagnosis Date  . Arthritis    "all over" (04/29/2017)  . BPH (benign prostatic hyperplasia)   . Chronic constipation   . Coronary artery disease cardiologist-  dr Daneen Schick   hx STEMI inferior  in 2007, distla RCA angioplasty  . First degree heart block   . GERD (gastroesophageal reflux disease)   . History of atrial fibrillation without current medication    episode Atrial Fib. during cardiac cath. 02-28-2006 resolved before discharge  . History of urinary retention    08/2010;  08/2016  . Hyperlipidemia   .  Hypertension   . OSA on CPAP    per sleetp study 10-08-2015 Moderate with AHI 19/hr now on CPAP  . RBBB (right bundle branch block)   . Skin cancer    "nose; left ear X 2" (04/29/2017)  . STEMI (ST elevation myocardial infarction) ([place]) 02/28/2006    inferior STEMI  s/p  RCA angioplasty  . Type II diabetes mellitus ([place])   . Wears glasses     Past Surgical History:  Procedure Laterality Date  . CARDIAC CATHETERIZATION  02-28-2006  dr Daneen Schick   PCI distal RCA /  mLAD  50%,  inferior akinesis, develpment of atrial fibrillation during case,  ef 50%  . CARDIOVASCULAR STRESS TEST  09/20/2002   normal nuclear study w/ no ischemia (diaphragmatic attenuation of the inferior wall)/  normal LV function and wall motion, ef 72%  . CATARACT EXTRACTION W/ INTRAOCULAR LENS  IMPLANT, BILATERAL Bilateral 2015  . COLONOSCOPY  last one 07-02-2013  . CORONARY ANGIOPLASTY WITH STENT PLACEMENT    . CORONARY STENT INTERVENTION N/A 04/29/2017   Procedure: CORONARY STENT INTERVENTION;  Surgeon: Leonie Man, MD;  Location: [facility];  Service: Cardiovascular;  Laterality: N/A;  . INTRAVASCULAR PRESSURE WIRE/FFR STUDY N/A 04/29/2017   Procedure: INTRAVASCULAR PRESSURE  WIRE/FFR STUDY;  Surgeon: Leonie Man, MD;  Location: [facility];  Service: Cardiovascular;  Laterality: N/A;  . LEFT HEART CATH AND CORONARY ANGIOGRAPHY N/A 04/29/2017   Procedure: LEFT HEART CATH AND CORONARY ANGIOGRAPHY;  Surgeon: Leonie Man, MD;  Location: [facility];  Service: Cardiovascular;  Laterality: N/A;  . SKIN CANCER EXCISION     "nose"  . THULIUM LASER TURP (TRANSURETHRAL RESECTION OF PROSTATE) N/A 10/07/2016   Procedure: THULIUM LASER TURP (TRANSURETHRAL RESECTION OF PROSTATE);  Surgeon: Carolan Clines, MD;  Location: [facility];  Service: Urology;  Laterality: N/A;  . TONSILLECTOMY  child  . TRANSURETHRAL RESECTION OF PROSTATE N/A 11/07/2016   Procedure: SECOND STAGE  TRANSURETHRAL RESECTION OF THE PROSTATE (TURP);  Surgeon: Carolan Clines, MD;  Location: [facility];  Service: Urology;  Laterality: N/A;     Medications: Current Meds  Medication Sig  . aspirin EC 81 MG tablet Take 1 tablet (81 mg total) daily by mouth.  . clopidogrel (PLAVIX) 75 MG tablet TAKE 1 TABLET (75 MG TOTAL) DAILY WITH BREAKFAST BY MOUTH.  . Dulaglutide (TRULICITY ) Use as directed weekly injection.  . isosorbide mononitrate (IMDUR) 30 MG 24 hr tablet TAKE 1 TABLET BY MOUTH EVERY DAY  . metFORMIN (GLUCOPHAGE) 500 MG tablet Take 500-1,000 mg 2 (two) times daily by mouth. Take 1 tablet (500 mg) in the morning & 2 tablets (1000 mg) by mouth at night.  . nitroGLYCERIN (NITROSTAT) 0.4 MG SL tablet Place 1 tablet (0.4 mg total) under the tongue every 5 (five) minutes as needed for chest pain.  . pantoprazole (PROTONIX) 40 MG tablet Take 1 tablet (40 mg total) by mouth at bedtime.  . pioglitazone (ACTOS) 30 MG tablet Take 1 tablet by mouth daily.  . simvastatin (ZOCOR) 5 MG tablet Take 5 mg by mouth every other day. Takes in the evening     Allergies: Allergies  Allergen Reactions  . Lisinopril Cough  . Codeine     Wife reports "changes his whole personality"  . Pravastatin Sodium     Myalgias  . Simvastatin Other (See Comments)    Myalgias    Social History: The patient  reports that he quit smoking about 47 years ago. His smoking use included cigars. His smokeless tobacco use includes chew. He reports that he drinks alcohol. He reports that he does not use drugs.   Family History: The patient's family history includes Colon cancer in his father.   Review of Systems: Please see the history of present illness.   Otherwise, the review of systems is positive for none.   All other systems are reviewed and negative.   Physical Exam: VS:  BP 118/62 (BP Location: Left Arm, Patient Position: Sitting, Cuff Size: Normal)   Pulse 60   Ht 6' (1.829 m)   Wt 228 lb 9.6 oz (103.7  kg)   SpO2 93% Comment: at rest  BMI 31.00 kg/m  .  BMI Body mass index is 31 kg/m.  Wt Readings from Last 3 Encounters:  03/04/18 228 lb 9.6 oz (103.7 kg)  01/26/18 226 lb 3.2 oz (102.6 kg)  09/05/17 224 lb (101.6 kg)    General: Pleasant. He looks younger than his state age. Alert and in no acute distress.   HEENT: Normal.  Neck: Supple, no JVD, carotid bruits, or masses noted.  Cardiac: Regular rate and rhythm. No murmurs, rubs, or gallops. No edema.  Respiratory:  Lungs are clear to auscultation bilaterally with  normal work of breathing.  GI: Soft and nontender.  MS: No deformity or atrophy. Gait and ROM intact.  Skin: Warm and dry. Color is normal.  Neuro:  Strength and sensation are intact and no gross focal deficits noted.  Psych: Alert, appropriate and with normal affect.   LABORATORY DATA:  EKG:  EKG is not ordered today.  Lab Results  Component Value Date   WBC 5.1 04/30/2017   HGB 12.6 (L) 04/30/2017   HCT 36.9 (L) 04/30/2017   PLT 119 (L) 04/30/2017   GLUCOSE 96 04/30/2017   NA 138 04/30/2017   K 3.6 04/30/2017   CL 106 04/30/2017   CREATININE 1.09 04/30/2017   BUN 12 04/30/2017   CO2 25 04/30/2017   INR 1.0 04/24/2017   HGBA1C 7.1 (H) 10/30/2016       BNP (last 3 results) No results for input(s): BNP in the last 8760 hours.  ProBNP (last 3 results) No results for input(s): PROBNP in the last 8760 hours.   Other Studies Reviewed Today:  Holter Study Highlights 09/2017    Normal sinus rhythm  Occasional PVC  Rare non-sustained supraventricular runs < 6 beats   NSR Rare nonsustained SVT < 6 beat salvos   HEART RATE EPISODES Minimum HR: 38 BPM at 6:53:04 AM(2) Maximum HR: 92 BPM at 2:03:23 PM Average HR: 60 BPM     CORONARY STENT INTERVENTION 04/2017  INTRAVASCULAR PRESSURE WIRE/FFR STUDY  LEFT HEART CATH AND CORONARY ANGIOGRAPHY  Conclusion     Prox RCA lesion is 75% stenosed (FFR 0.78). -Site of previous angioplasty in  setting of STEMI  A drug-eluting stent was successfully placed using a STENT SYNERGY DES 3X24. Postdilated to 3.34 mm  Post intervention, there is a 0% residual stenosis.  Dist RCA lesion is 45% stenosed.  Ost LAD to Prox LAD lesion is 50% stenosed. Prox LAD lesion is 40% stenosed. Mid-distal LAD lesion is 65% stenosed. While it is possible that these lesions in tandem could be FFR positive, neither lesion alone is a great option for beneficial PCI.  There is mild left ventricular systolic dysfunction -with basal to mid inferior hypokinesis..  The left ventricular ejection fraction is 45-50% by visual estimate.   Severe single-vessel disease involving the previously occluded RCA.  This area was treated with a DES stent.   There is also moderate diffuse disease in the LAD that is probably best treated medically.  Relatively preserved LVEF with minimally elevated LVEDP.  Plan per discussion with Dr. Tamala Julian will be to treat the LAD lesion medically and reassess if symptoms are not improving.  The patient will be monitored overnight for post cath care and TR band removal.  He will be discharged in the morning if stable.  Dual antiplatelet therapy for minimum of 3-6 months, after which point aspirin can be discontinued.   Glenetta Hew, M.D., M.S.     Assessment/Plan:  1. CAD with remote STEMI/PCI in 2007 with last cath/PCI to the RCA in 04/2017 - has known residual disease that is treated medically - he has no active chest pain and is not short of breath. Fatigue continues to be an issue but he is fairly active. He exercises 3 times a week as well. He is on chronic nitrate therapy. We are going to check some labs here today. Further disposition to follow.   2. HTN - BP looks fine on his current regimen. No changes made today.   3. Bradycardia - Holter from April was stable.  4. HLD - needs lipids - remains on statin.   5. OSA - followed by Dr. Radford Pax  6. Fatigue - see  above. If labs are ok may need to consider increasing Imdur to see if that would help if this ischemic in etiology.   Current medicines are reviewed with the patient today.  The patient does not have concerns regarding medicines other than what has been noted above.  The following changes have been made:  See above.  Labs/ tests ordered today include:    Orders Placed This Encounter  Procedures  . Basic metabolic panel  . CBC  . Hepatic function panel  . Lipid panel  . TSH     Disposition:   FU with Dr. Tamala Julian in 6 months.  I am happy to see him back anytime.   Patient is agreeable to this plan and will call if any problems develop in the interim.   SignedTruitt Merle, NP  03/04/2018 1:59 PM  [facility] [address] [place], [place]  [address] Phone: [phone] Fax: [phone]

## 2018-03-04 ENCOUNTER — Ambulatory Visit: Payer: Medicare Other | Admitting: Nurse Practitioner

## 2018-03-04 ENCOUNTER — Encounter: Payer: Self-pay | Admitting: Nurse Practitioner

## 2018-03-04 VITALS — BP 118/62 | HR 60 | Ht 72.0 in | Wt 228.6 lb

## 2018-03-04 DIAGNOSIS — I259 Chronic ischemic heart disease, unspecified: Secondary | ICD-10-CM

## 2018-03-04 DIAGNOSIS — I10 Essential (primary) hypertension: Secondary | ICD-10-CM | POA: Diagnosis not present

## 2018-03-04 DIAGNOSIS — E782 Mixed hyperlipidemia: Secondary | ICD-10-CM

## 2018-03-04 DIAGNOSIS — R001 Bradycardia, unspecified: Secondary | ICD-10-CM | POA: Diagnosis not present

## 2018-03-04 NOTE — Patient Instructions (Addendum)
We will be checking the following labs today - BMET, CBC, HPF, Lipids and TSH   Medication Instructions:    Continue with your current medicines.     Testing/Procedures To Be Arranged:  N/A  Follow-Up:   See Dr. Tamala Julian in 6 months.     Other Special Instructions:   N/A    If you need a refill on your cardiac medications before your next appointment, please call your pharmacy.   Call the [place] office at [phone] if you have any questions, problems or concerns.

## 2018-03-05 LAB — LIPID PANEL
Chol/HDL Ratio: 4.2 ratio (ref 0.0–5.0)
Cholesterol, Total: 163 mg/dL (ref 100–199)
HDL: 39 mg/dL — ABNORMAL LOW (ref >39)
LDL Calculated: 79 mg/dL (ref 0–99)
Triglycerides: 225 mg/dL — ABNORMAL HIGH (ref 0–149)
VLDL Cholesterol Cal: 45 mg/dL — ABNORMAL HIGH (ref 5–40)

## 2018-03-05 LAB — BASIC METABOLIC PANEL
BUN/Creatinine Ratio: 15 (ref 10–24)
BUN: 18 mg/dL (ref 8–27)
CO2: 24 mmol/L (ref 20–29)
Calcium: 9.6 mg/dL (ref 8.6–10.2)
Chloride: 103 mmol/L (ref 96–106)
Creatinine, Ser: 1.19 mg/dL (ref 0.76–1.27)
GFR calc Af Amer: 65 mL/min/{1.73_m2} (ref >59)
GFR calc non Af Amer: 56 mL/min/{1.73_m2} — ABNORMAL LOW (ref >59)
Glucose: 207 mg/dL — ABNORMAL HIGH (ref 65–99)
Potassium: 4.7 mmol/L (ref 3.5–5.2)
Sodium: 142 mmol/L (ref 134–144)

## 2018-03-05 LAB — TSH: TSH: 2.22 u[IU]/mL (ref 0.450–4.500)

## 2018-03-05 LAB — HEPATIC FUNCTION PANEL
ALT: 30 IU/L (ref 0–44)
AST: 25 IU/L (ref 0–40)
Albumin: 4.6 g/dL (ref 3.5–4.7)
Alkaline Phosphatase: 77 IU/L (ref 39–117)
Bilirubin Total: 0.4 mg/dL (ref 0.0–1.2)
Bilirubin, Direct: 0.14 mg/dL (ref 0.00–0.40)
Total Protein: 6.5 g/dL (ref 6.0–8.5)

## 2018-03-05 LAB — CBC
Hematocrit: 38.9 % (ref 37.5–51.0)
Hemoglobin: 13.2 g/dL (ref 13.0–17.7)
MCH: 31.7 pg (ref 26.6–33.0)
MCHC: 33.9 g/dL (ref 31.5–35.7)
MCV: 94 fL (ref 79–97)
Platelets: 125 10*3/uL — ABNORMAL LOW (ref 150–450)
RBC: 4.16 x10E6/uL (ref 4.14–5.80)
RDW: 14.3 % (ref 12.3–15.4)
WBC: 5.5 10*3/uL (ref 3.4–10.8)

## 2018-03-10 ENCOUNTER — Other Ambulatory Visit: Payer: Self-pay | Admitting: *Deleted

## 2018-03-10 MED ORDER — ISOSORBIDE MONONITRATE ER 30 MG PO TB24: 60.0000 mg | ORAL_TABLET | Freq: Every day | ORAL | Status: DC

## 2018-04-19 ENCOUNTER — Other Ambulatory Visit: Payer: Self-pay | Admitting: Cardiology

## 2018-07-02 DIAGNOSIS — H5203 Hypermetropia, bilateral: Secondary | ICD-10-CM | POA: Diagnosis not present

## 2018-07-02 DIAGNOSIS — H35372 Puckering of macula, left eye: Secondary | ICD-10-CM | POA: Diagnosis not present

## 2018-07-28 DIAGNOSIS — H02831 Dermatochalasis of right upper eyelid: Secondary | ICD-10-CM | POA: Diagnosis not present

## 2018-07-28 DIAGNOSIS — Z961 Presence of intraocular lens: Secondary | ICD-10-CM | POA: Diagnosis not present

## 2018-07-28 DIAGNOSIS — H18413 Arcus senilis, bilateral: Secondary | ICD-10-CM | POA: Diagnosis not present

## 2018-07-28 DIAGNOSIS — H26491 Other secondary cataract, right eye: Secondary | ICD-10-CM | POA: Diagnosis not present

## 2018-07-28 DIAGNOSIS — H35372 Puckering of macula, left eye: Secondary | ICD-10-CM | POA: Diagnosis not present

## 2018-08-03 DIAGNOSIS — M1712 Unilateral primary osteoarthritis, left knee: Secondary | ICD-10-CM | POA: Diagnosis not present

## 2018-08-05 DIAGNOSIS — Z9841 Cataract extraction status, right eye: Secondary | ICD-10-CM | POA: Diagnosis not present

## 2018-08-11 DIAGNOSIS — H26492 Other secondary cataract, left eye: Secondary | ICD-10-CM | POA: Diagnosis not present

## 2018-08-20 DIAGNOSIS — Z961 Presence of intraocular lens: Secondary | ICD-10-CM | POA: Diagnosis not present

## 2018-08-20 DIAGNOSIS — H5989 Other postprocedural complications and disorders of eye and adnexa, not elsewhere classified: Secondary | ICD-10-CM | POA: Diagnosis not present

## 2018-08-20 DIAGNOSIS — H524 Presbyopia: Secondary | ICD-10-CM | POA: Diagnosis not present

## 2018-08-27 DIAGNOSIS — I10 Essential (primary) hypertension: Secondary | ICD-10-CM | POA: Diagnosis not present

## 2018-08-27 DIAGNOSIS — D696 Thrombocytopenia, unspecified: Secondary | ICD-10-CM | POA: Diagnosis not present

## 2018-08-27 DIAGNOSIS — E782 Mixed hyperlipidemia: Secondary | ICD-10-CM | POA: Diagnosis not present

## 2018-08-27 DIAGNOSIS — E1122 Type 2 diabetes mellitus with diabetic chronic kidney disease: Secondary | ICD-10-CM | POA: Diagnosis not present

## 2018-08-30 NOTE — Progress Notes (Signed)
Cardiology Office Note:    Date:  08/31/2018   ID:  EH SESAY, DOB March 25, 1932, MRN [id number]  PCP:  Isaias Sakai, DO  Cardiologist:  Sinclair Grooms, MD   Referring MD: Alonna Buckler*   Chief Complaint  Patient presents with  . Coronary Artery Disease    History of Present Illness:    Eric Carter is a 85 y.o. male with a hx of past medical history significant for OSA on CPAP, HTN, HLD, DM and CAD s/p acute anterior STEMI treated with PCI to RCA in 2007 and recent LHC for shortness of breath and fatigue revealing 75% stenosis of pRCA treated with DES by Dr. Ellyn Hack. He also has diffuse disease inthe LAD being treated medically. He is on DAPT with aspirin and Plavix as well as statin, ARB and diabetes meds.   No cardiac complaints.  No nitroglycerin use or or restriction in activity because of angina.  He has not had blood in the urine or stool.  Last stent implantation was by Dr. Ellyn Hack in 2018.  Time he received a proximal RCA DES.  Has dyspnea on exertion he feels because of physical deconditioning.  Past Medical History:  Diagnosis Date  . Arthritis    "all over" (04/29/2017)  . BPH (benign prostatic hyperplasia)   . Chronic constipation   . Coronary artery disease cardiologist-  dr Daneen Schick   hx STEMI inferior  in 2007, distla RCA angioplasty  . First degree heart block   . GERD (gastroesophageal reflux disease)   . History of atrial fibrillation without current medication    episode Atrial Fib. during cardiac cath. 02-28-2006 resolved before discharge  . History of urinary retention    08/2010;  08/2016  . Hyperlipidemia   . Hypertension   . OSA on CPAP    per sleetp study 10-08-2015 Moderate with AHI 19/hr now on CPAP  . RBBB (right bundle branch block)   . Skin cancer    "nose; left ear X 2" (04/29/2017)  . STEMI (ST elevation myocardial infarction) ([place]) 02/28/2006    inferior STEMI  s/p  RCA angioplasty  . Type II diabetes  mellitus ([place])   . Wears glasses     Past Surgical History:  Procedure Laterality Date  . CARDIAC CATHETERIZATION  02-28-2006  dr Daneen Schick   PCI distal RCA /  mLAD  50%,  inferior akinesis, develpment of atrial fibrillation during case,  ef 50%  . CARDIOVASCULAR STRESS TEST  09/20/2002   normal nuclear study w/ no ischemia (diaphragmatic attenuation of the inferior wall)/  normal LV function and wall motion, ef 72%  . CATARACT EXTRACTION W/ INTRAOCULAR LENS  IMPLANT, BILATERAL Bilateral 2015  . COLONOSCOPY  last one 07-02-2013  . CORONARY ANGIOPLASTY WITH STENT PLACEMENT    . CORONARY STENT INTERVENTION N/A 04/29/2017   Procedure: CORONARY STENT INTERVENTION;  Surgeon: Leonie Man, MD;  Location: [facility];  Service: Cardiovascular;  Laterality: N/A;  . INTRAVASCULAR PRESSURE WIRE/FFR STUDY N/A 04/29/2017   Procedure: INTRAVASCULAR PRESSURE WIRE/FFR STUDY;  Surgeon: Leonie Man, MD;  Location: [facility];  Service: Cardiovascular;  Laterality: N/A;  . LEFT HEART CATH AND CORONARY ANGIOGRAPHY N/A 04/29/2017   Procedure: LEFT HEART CATH AND CORONARY ANGIOGRAPHY;  Surgeon: Leonie Man, MD;  Location: [facility];  Service: Cardiovascular;  Laterality: N/A;  . SKIN CANCER EXCISION     "nose"  . THULIUM LASER TURP (TRANSURETHRAL RESECTION  OF PROSTATE) N/A 10/07/2016   Procedure: THULIUM LASER TURP (TRANSURETHRAL RESECTION OF PROSTATE);  Surgeon: Carolan Clines, MD;  Location: [facility];  Service: Urology;  Laterality: N/A;  . TONSILLECTOMY  child  . TRANSURETHRAL RESECTION OF PROSTATE N/A 11/07/2016   Procedure: SECOND STAGE TRANSURETHRAL RESECTION OF THE PROSTATE (TURP);  Surgeon: Carolan Clines, MD;  Location: [facility];  Service: Urology;  Laterality: N/A;    Current Medications: Current Meds  Medication Sig  . clopidogrel (PLAVIX) 75 MG tablet TAKE 1 TABLET (75 MG TOTAL) DAILY WITH BREAKFAST BY MOUTH.  . Dulaglutide  (TRULICITY [place]) Use as directed weekly injection.  Marland Kitchen glimepiride (AMARYL) 4 MG tablet Take 4 mg by mouth 2 (two) times daily.  . isosorbide mononitrate (IMDUR) 30 MG 24 hr tablet Take 2 tablets (60 mg total) by mouth daily.  . metFORMIN (GLUCOPHAGE-XR) 500 MG 24 hr tablet Take 2,000 mg by mouth daily.  . nitroGLYCERIN (NITROSTAT) 0.4 MG SL tablet Place 1 tablet (0.4 mg total) under the tongue every 5 (five) minutes as needed for chest pain.  . pantoprazole (PROTONIX) 40 MG tablet Take 1 tablet (40 mg total) by mouth at bedtime.  . pioglitazone (ACTOS) 30 MG tablet Take 1 tablet by mouth daily.  . simvastatin (ZOCOR) 5 MG tablet Take 5 mg by mouth every other day. Takes in the evening  . [DISCONTINUED] aspirin EC 81 MG tablet Take 1 tablet (81 mg total) daily by mouth.  . [DISCONTINUED] nitroGLYCERIN (NITROSTAT) 0.4 MG SL tablet Place 1 tablet (0.4 mg total) under the tongue every 5 (five) minutes as needed for chest pain.     Allergies:   Lisinopril; Codeine; Pravastatin sodium; and Simvastatin   Social History   Socioeconomic History  . Marital status: Married    Spouse name: Not on file  . Number of children: Not on file  . Years of education: Not on file  . Highest education level: Not on file  Occupational History  . Not on file  Social Needs  . Financial resource strain: Not on file  . Food insecurity:    Worry: Not on file    Inability: Not on file  . Transportation needs:    Medical: Not on file    Non-medical: Not on file  Tobacco Use  . Smoking status: Former Smoker    Types: Cigars    Last attempt to quit: 10/02/1970    Years since quitting: 47.9  . Smokeless tobacco: Current User    Types: Chew  . Tobacco comment: per pt occasionally chews tobacco  Substance and Sexual Activity  . Alcohol use: Yes    Comment: RARELY   . Drug use: No  . Sexual activity: Not on file  Lifestyle  . Physical activity:    Days per week: Not on file    Minutes per session: Not on  file  . Stress: Not on file  Relationships  . Social connections:    Talks on phone: Not on file    Gets together: Not on file    Attends religious service: Not on file    Active member of club or organization: Not on file    Attends meetings of clubs or organizations: Not on file    Relationship status: Not on file  Other Topics Concern  . Not on file  Social History Narrative  . Not on file     Family History: The patient's family history includes Colon cancer in his father. There is no  history of Stomach cancer.  ROS:   Please see the history of present illness.    Knee discomfort.  Physically and active.  All other systems reviewed and are negative.  EKGs/Labs/Other Studies Reviewed:    The following studies were reviewed today: No new functional data.  48-hour Holter monitor September 19, 2017: Study Highlights     Normal sinus rhythm  Occasional PVC  Rare non-sustained supraventricular runs < 6 beats   NSR Rare nonsustained SVT < 6 beat salvos      EKG:  EKG performed today reveals sinus bradycardia 53 bpm, right bundle branch block, inferior infarct with left axis deviation.  When compared to prior performed in November 2018, no significant change has occurred.  Recent Labs: 03/04/2018: ALT 30; BUN 18; Creatinine, Ser 1.19; Hemoglobin 13.2; Platelets 125; Potassium 4.7; Sodium 142; TSH 2.220  Recent Lipid Panel    Component Value Date/Time   CHOL 163 03/04/2018 1358   TRIG 225 (H) 03/04/2018 1358   HDL 39 (L) 03/04/2018 1358   CHOLHDL 4.2 03/04/2018 1358   LDLCALC 79 03/04/2018 1358    Physical Exam:    VS:  BP 118/68   Pulse (!) 53   Ht 6' (1.829 m)   Wt 233 lb 12.8 oz (106.1 kg)   SpO2 95%   BMI 31.71 kg/m     Wt Readings from Last 3 Encounters:  08/31/18 233 lb 12.8 oz (106.1 kg)  03/04/18 228 lb 9.6 oz (103.7 kg)  01/26/18 226 lb 3.2 oz (102.6 kg)     GEN: Obese and somewhat pale appearing.  Complexion has been this way for as long as  I have known him.  Hemoglobin and December was 13.6.. No acute distress HEENT: Normal NECK: No JVD. LYMPHATICS: No lymphadenopathy CARDIAC: RRR.  2/6 right upper sternal border systolic murmur, no gallop, no edema VASCULAR: 2+ bilateral radial and carotid pulses, no bruits RESPIRATORY:  Clear to auscultation without rales, wheezing or rhonchi  ABDOMEN: Soft, non-tender, non-distended, No pulsatile mass, MUSCULOSKELETAL: No deformity  SKIN: Warm and dry NEUROLOGIC:  Alert and oriented x 3 PSYCHIATRIC:  Normal affect   ASSESSMENT:    1. Coronary artery disease involving native coronary artery of native heart with angina pectoris ([place])   2. Essential hypertension, benign   3. Mixed hyperlipidemia   4. Controlled type 2 diabetes mellitus with diabetic nephropathy, without long-term current use of insulin ([place])   5. OSA (obstructive sleep apnea)    PLAN:    In order of problems listed above:  1. Asymptomatic with reference to angina.  Multiple modifiable risk factors are not being adequately controlled.  Secondary prevention discussed and strategy for improvement developed. 2. Target blood pressure 130/80 mmHg discussed.  Weight loss and low-salt diet reiterated 3. Low saturated fat diet reiterated.  LDL was greater than 90 when last evaluated in May.  He is statin intolerant.  Will add ezetimibe 10 mg/day. 4. A1c is unacceptable at 7.8.  Needs to have an SGLT2 (dapagliflozin or empagliflozin) added to diabetes regimen. 5. Compliance with sleep apnea is encouraged.  Overall education and awareness concerning primary/secondary risk prevention was discussed in detail: LDL less than 70, hemoglobin A1c less than 7, blood pressure target less than 130/80 mmHg, >150 minutes of moderate aerobic activity per week, avoidance of smoking, weight control (via diet and exercise), and continued surveillance/management of/for obstructive sleep apnea.  Increase physical activity, better control of  lipids, and better control of diabetes are essential.  Lipid  panel in 2 months.  Clinical follow-up 1 year.   Medication Adjustments/Labs and Tests Ordered: Current medicines are reviewed at length with the patient today.  Concerns regarding medicines are outlined above.  Orders Placed This Encounter  Procedures  . Hepatic function panel  . Lipid panel  . EKG 12-Lead   Meds ordered this encounter  Medications  . nitroGLYCERIN (NITROSTAT) 0.4 MG SL tablet    Sig: Place 1 tablet (0.4 mg total) under the tongue every 5 (five) minutes as needed for chest pain.    Dispense:  25 tablet    Refill:  3  . ezetimibe (ZETIA) 10 MG tablet    Sig: Take 1 tablet (10 mg total) by mouth daily.    Dispense:  90 tablet    Refill:  3    There are no Patient Instructions on file for this visit.   Signed, Sinclair Grooms, MD  08/31/2018 9:22 AM    [place]

## 2018-08-31 ENCOUNTER — Other Ambulatory Visit: Payer: Self-pay

## 2018-08-31 ENCOUNTER — Encounter: Payer: Self-pay | Admitting: Interventional Cardiology

## 2018-08-31 ENCOUNTER — Ambulatory Visit: Payer: Medicare Other | Admitting: Interventional Cardiology

## 2018-08-31 VITALS — BP 118/68 | HR 53 | Ht 72.0 in | Wt 233.8 lb

## 2018-08-31 DIAGNOSIS — E782 Mixed hyperlipidemia: Secondary | ICD-10-CM

## 2018-08-31 DIAGNOSIS — I10 Essential (primary) hypertension: Secondary | ICD-10-CM | POA: Diagnosis not present

## 2018-08-31 DIAGNOSIS — I25119 Atherosclerotic heart disease of native coronary artery with unspecified angina pectoris: Secondary | ICD-10-CM

## 2018-08-31 DIAGNOSIS — G4733 Obstructive sleep apnea (adult) (pediatric): Secondary | ICD-10-CM

## 2018-08-31 DIAGNOSIS — E1121 Type 2 diabetes mellitus with diabetic nephropathy: Secondary | ICD-10-CM | POA: Diagnosis not present

## 2018-08-31 MED ORDER — EZETIMIBE 10 MG PO TABS: 10.0000 mg | ORAL_TABLET | Freq: Every day | ORAL | 3 refills | Status: DC

## 2018-08-31 MED ORDER — NITROGLYCERIN 0.4 MG SL SUBL: 0.4000 mg | SUBLINGUAL_TABLET | SUBLINGUAL | 3 refills | Status: DC | PRN

## 2018-08-31 NOTE — Patient Instructions (Signed)
Medication Instructions:  1) DISCONTINUE Aspirin 2) START Zetia 10mg  once daily  If you need a refill on your cardiac medications before your next appointment, please call your pharmacy.   Lab work: Your physician recommends that you return for lab work in: 2-3 months (Lipid, liver).  You need to be fasting for these labs (nothing to eat or drink after midnight except water and black coffee)  If you have labs (blood work) drawn today and your tests are completely normal, you will receive your results only by: Marland Kitchen MyChart Message (if you have MyChart) OR . A paper copy in the mail If you have any lab test that is abnormal or we need to change your treatment, we will call you to review the results.  Testing/Procedures: Nond  Follow-Up: At [facility], you and your health needs are our priority.  As part of our continuing mission to provide you with exceptional heart care, we have created designated Provider Care Teams.  These Care Teams include your primary Cardiologist (physician) and Advanced Practice Providers (APPs -  Physician Assistants and Nurse Practitioners) who all work together to provide you with the care you need, when you need it. You will need a follow up appointment in 12 months.  Please call our office 2 months in advance to schedule this appointment.  You may see Sinclair Grooms, MD or one of the following Advanced Practice Providers on your designated Care Team:   Truitt Merle, NP Cecilie Kicks, NP . Kathyrn Drown, NP  Any Other Special Instructions Will Be Listed Below (If Applicable).

## 2018-11-16 ENCOUNTER — Other Ambulatory Visit: Payer: Self-pay

## 2018-11-16 ENCOUNTER — Encounter (INDEPENDENT_AMBULATORY_CARE_PROVIDER_SITE_OTHER): Payer: Self-pay

## 2018-11-16 ENCOUNTER — Other Ambulatory Visit: Payer: Medicare Other | Admitting: *Deleted

## 2018-11-16 DIAGNOSIS — E782 Mixed hyperlipidemia: Secondary | ICD-10-CM | POA: Diagnosis not present

## 2018-11-16 LAB — HEPATIC FUNCTION PANEL
ALT: 29 IU/L (ref 0–44)
AST: 21 IU/L (ref 0–40)
Albumin: 4.5 g/dL (ref 3.6–4.6)
Alkaline Phosphatase: 82 IU/L (ref 39–117)
Bilirubin Total: 0.5 mg/dL (ref 0.0–1.2)
Bilirubin, Direct: 0.18 mg/dL (ref 0.00–0.40)
Total Protein: 6.6 g/dL (ref 6.0–8.5)

## 2018-11-16 LAB — LIPID PANEL
Chol/HDL Ratio: 3.9 ratio (ref 0.0–5.0)
Cholesterol, Total: 164 mg/dL (ref 100–199)
HDL: 42 mg/dL (ref >39)
LDL Calculated: 86 mg/dL (ref 0–99)
Triglycerides: 182 mg/dL — ABNORMAL HIGH (ref 0–149)
VLDL Cholesterol Cal: 36 mg/dL (ref 5–40)

## 2018-12-25 ENCOUNTER — Other Ambulatory Visit: Payer: Self-pay | Admitting: Interventional Cardiology

## 2018-12-28 DIAGNOSIS — E1165 Type 2 diabetes mellitus with hyperglycemia: Secondary | ICD-10-CM | POA: Diagnosis not present

## 2018-12-28 DIAGNOSIS — E1122 Type 2 diabetes mellitus with diabetic chronic kidney disease: Secondary | ICD-10-CM | POA: Diagnosis not present

## 2018-12-28 DIAGNOSIS — I10 Essential (primary) hypertension: Secondary | ICD-10-CM | POA: Diagnosis not present

## 2018-12-28 DIAGNOSIS — N183 Chronic kidney disease, stage 3 (moderate): Secondary | ICD-10-CM | POA: Diagnosis not present

## 2019-03-02 DIAGNOSIS — D692 Other nonthrombocytopenic purpura: Secondary | ICD-10-CM | POA: Diagnosis not present

## 2019-03-02 DIAGNOSIS — D0462 Carcinoma in situ of skin of left upper limb, including shoulder: Secondary | ICD-10-CM | POA: Diagnosis not present

## 2019-03-02 DIAGNOSIS — Z85828 Personal history of other malignant neoplasm of skin: Secondary | ICD-10-CM | POA: Diagnosis not present

## 2019-03-02 DIAGNOSIS — D3613 Benign neoplasm of peripheral nerves and autonomic nervous system of lower limb, including hip: Secondary | ICD-10-CM | POA: Diagnosis not present

## 2019-03-02 DIAGNOSIS — L570 Actinic keratosis: Secondary | ICD-10-CM | POA: Diagnosis not present

## 2019-03-18 DIAGNOSIS — Z9181 History of falling: Secondary | ICD-10-CM | POA: Diagnosis not present

## 2019-03-18 DIAGNOSIS — Z139 Encounter for screening, unspecified: Secondary | ICD-10-CM | POA: Diagnosis not present

## 2019-03-18 DIAGNOSIS — E785 Hyperlipidemia, unspecified: Secondary | ICD-10-CM | POA: Diagnosis not present

## 2019-03-18 DIAGNOSIS — Z0000 Encounter for general adult medical examination without abnormal findings: Secondary | ICD-10-CM | POA: Diagnosis not present

## 2019-03-22 DIAGNOSIS — E1122 Type 2 diabetes mellitus with diabetic chronic kidney disease: Secondary | ICD-10-CM | POA: Diagnosis not present

## 2019-03-22 DIAGNOSIS — E1165 Type 2 diabetes mellitus with hyperglycemia: Secondary | ICD-10-CM | POA: Diagnosis not present

## 2019-03-22 DIAGNOSIS — N183 Chronic kidney disease, stage 3 unspecified: Secondary | ICD-10-CM | POA: Diagnosis not present

## 2019-03-22 DIAGNOSIS — I10 Essential (primary) hypertension: Secondary | ICD-10-CM | POA: Diagnosis not present

## 2019-03-22 DIAGNOSIS — Z79899 Other long term (current) drug therapy: Secondary | ICD-10-CM | POA: Diagnosis not present

## 2019-04-14 ENCOUNTER — Other Ambulatory Visit: Payer: Self-pay | Admitting: Cardiology

## 2019-04-14 NOTE — Telephone Encounter (Signed)
Please review for refill. Thank you! 

## 2019-06-23 DIAGNOSIS — Z85828 Personal history of other malignant neoplasm of skin: Secondary | ICD-10-CM | POA: Diagnosis not present

## 2019-06-23 DIAGNOSIS — L570 Actinic keratosis: Secondary | ICD-10-CM | POA: Diagnosis not present

## 2019-06-23 DIAGNOSIS — L821 Other seborrheic keratosis: Secondary | ICD-10-CM | POA: Diagnosis not present

## 2019-06-23 DIAGNOSIS — D0472 Carcinoma in situ of skin of left lower limb, including hip: Secondary | ICD-10-CM | POA: Diagnosis not present

## 2019-07-08 ENCOUNTER — Other Ambulatory Visit: Payer: Self-pay | Admitting: Cardiology

## 2019-07-08 MED ORDER — ISOSORBIDE MONONITRATE ER 30 MG PO TB24: 60.0000 mg | ORAL_TABLET | Freq: Every day | ORAL | 0 refills | Status: DC

## 2019-07-08 NOTE — Telephone Encounter (Signed)
Please review for refill on Isosorbide [place]

## 2019-07-26 DIAGNOSIS — E1122 Type 2 diabetes mellitus with diabetic chronic kidney disease: Secondary | ICD-10-CM | POA: Diagnosis not present

## 2019-07-26 DIAGNOSIS — Z79899 Other long term (current) drug therapy: Secondary | ICD-10-CM | POA: Diagnosis not present

## 2019-07-26 DIAGNOSIS — E782 Mixed hyperlipidemia: Secondary | ICD-10-CM | POA: Diagnosis not present

## 2019-07-29 DIAGNOSIS — D696 Thrombocytopenia, unspecified: Secondary | ICD-10-CM | POA: Diagnosis not present

## 2019-07-29 DIAGNOSIS — I10 Essential (primary) hypertension: Secondary | ICD-10-CM | POA: Diagnosis not present

## 2019-07-29 DIAGNOSIS — E1165 Type 2 diabetes mellitus with hyperglycemia: Secondary | ICD-10-CM | POA: Diagnosis not present

## 2019-07-29 DIAGNOSIS — E782 Mixed hyperlipidemia: Secondary | ICD-10-CM | POA: Diagnosis not present

## 2019-08-18 ENCOUNTER — Other Ambulatory Visit: Payer: Self-pay | Admitting: Interventional Cardiology

## 2019-08-24 DIAGNOSIS — E119 Type 2 diabetes mellitus without complications: Secondary | ICD-10-CM | POA: Diagnosis not present

## 2019-08-24 DIAGNOSIS — H524 Presbyopia: Secondary | ICD-10-CM | POA: Diagnosis not present

## 2019-09-06 ENCOUNTER — Encounter (INDEPENDENT_AMBULATORY_CARE_PROVIDER_SITE_OTHER): Payer: Medicare Other | Admitting: Ophthalmology

## 2019-09-06 DIAGNOSIS — H353132 Nonexudative age-related macular degeneration, bilateral, intermediate dry stage: Secondary | ICD-10-CM | POA: Diagnosis not present

## 2019-09-06 DIAGNOSIS — H35372 Puckering of macula, left eye: Secondary | ICD-10-CM | POA: Diagnosis not present

## 2019-09-06 DIAGNOSIS — H43813 Vitreous degeneration, bilateral: Secondary | ICD-10-CM

## 2019-09-07 NOTE — Progress Notes (Signed)
Cardiology Office Note:    Date:  09/08/2019   ID:  Eric Carter, DOB [birth year], MRN [id number]  PCP:  Eric Sakai, DO  Cardiologist:  Eric Grooms, Carter   Referring Carter: Eric Carter*   Chief Complaint  Patient presents with  . Coronary Artery Disease  . Hyperlipidemia    History of Present Illness:    Eric Carter is a 86 y.o. male with a hx of OSA on CPAP, HTN, HLD, DM and CAD s/p acute anterior STEMI treated with PCI to RCA Carter 2007 and recent LHC for shortness of breath and fatigue revealing 75% stenosis of pRCA treated with DES by Eric Carter. He also has diffuse disease inthe LAD being treated medically. He is on DAPT with aspirin and Plavix as well as statin, ARB and diabetes meds.  Eric Carter has less energy than he once did.  He does not feel like doing physical activities.  He gets short of breath faster than he used to.  This is not an acute complaint as it has been progressive over the last 3 to 5 years.  He denies orthopnea, PND, lower extremity swelling.  He has not had angina, need for nitroglycerin, orthopnea, PND, palpitations, or syncope.  He rides a stationary bicycle 30 minutes 2 days a week.  He still lives independently, drives his own car, and has no significant issues with his memory.  He now sees Eric Carter [place].  Past Medical History:  Diagnosis Date  . Arthritis    "all over" (04/29/2017)  . BPH (benign prostatic hyperplasia)   . Chronic constipation   . Coronary artery disease cardiologist-  dr Eric Carter   hx STEMI inferior  Carter 2007, distla RCA angioplasty  . First degree heart block   . GERD (gastroesophageal reflux disease)   . History of atrial fibrillation without current medication    episode Atrial Fib. during cardiac cath. 02-28-2006 resolved before discharge  . History of urinary retention    08/2010;  08/2016  . Hyperlipidemia   . Hypertension   . OSA on CPAP    per sleetp study  10-08-2015 Moderate with AHI 19/hr now on CPAP  . RBBB (right bundle branch block)   . Skin cancer    "nose; left ear X 2" (04/29/2017)  . STEMI (ST elevation myocardial infarction) ([place]) 02/28/2006    inferior STEMI  s/p  RCA angioplasty  . Type II diabetes mellitus ([place])   . Wears glasses     Past Surgical History:  Procedure Laterality Date  . CARDIAC CATHETERIZATION  02-28-2006  dr Eric Carter   PCI distal RCA /  mLAD  50%,  inferior akinesis, develpment of atrial fibrillation during case,  ef 50%  . CARDIOVASCULAR STRESS TEST  09/20/2002   normal nuclear study w/ no ischemia (diaphragmatic attenuation of the inferior wall)/  normal LV function and wall motion, ef 72%  . CATARACT EXTRACTION W/ INTRAOCULAR LENS  IMPLANT, BILATERAL Bilateral 2015  . COLONOSCOPY  last one 07-02-2013  . CORONARY ANGIOPLASTY WITH STENT PLACEMENT    . CORONARY STENT INTERVENTION N/A 04/29/2017   Procedure: CORONARY STENT INTERVENTION;  Surgeon: Eric Carter;  Location: [facility];  Service: Cardiovascular;  Laterality: N/A;  . INTRAVASCULAR PRESSURE WIRE/FFR STUDY N/A 04/29/2017   Procedure: INTRAVASCULAR PRESSURE WIRE/FFR STUDY;  Surgeon: Eric Carter;  Location: [facility];  Service: Cardiovascular;  Laterality: N/A;  . LEFT HEART  CATH AND CORONARY ANGIOGRAPHY N/A 04/29/2017   Procedure: LEFT HEART CATH AND CORONARY ANGIOGRAPHY;  Surgeon: Eric Carter;  Location: [facility];  Service: Cardiovascular;  Laterality: N/A;  . SKIN CANCER EXCISION     "nose"  . THULIUM LASER TURP (TRANSURETHRAL RESECTION OF PROSTATE) N/A 10/07/2016   Procedure: THULIUM LASER TURP (TRANSURETHRAL RESECTION OF PROSTATE);  Surgeon: Eric Carter;  [facility];  Service: Urology;  Laterality: N/A;  . TONSILLECTOMY  child  . TRANSURETHRAL RESECTION OF PROSTATE N/A 11/07/2016   Procedure: SECOND STAGE TRANSURETHRAL RESECTION OF THE PROSTATE (TURP);   Surgeon: Eric Carter;  Location: [facility];  Service: Urology;  Laterality: N/A;    Current Medications: Current Meds  Medication Sig  . clopidogrel (PLAVIX) 75 MG tablet TAKE 1 TABLET BY MOUTH EVERY DAY WITH BREAKFAST  . ezetimibe (ZETIA) 10 MG tablet TAKE 1 TABLET BY MOUTH EVERY DAY  . glimepiride (AMARYL) 4 MG tablet Take 4 mg by mouth 2 (two) times daily.  . isosorbide mononitrate (IMDUR) 30 MG 24 hr tablet Take 2 tablets (60 mg total) by mouth daily. Please keep upcoming appt Carter [place] with Dr. Tamala Carter for future refills. Thank you  . metFORMIN (GLUCOPHAGE-XR) 500 MG 24 hr tablet Take 2,000 mg by mouth daily.  . nitroGLYCERIN (NITROSTAT) 0.4 MG SL tablet Place 1 tablet (0.4 mg total) under the tongue every 5 (five) minutes as needed for chest pain.  . pantoprazole (PROTONIX) 40 MG tablet Take 1 tablet (40 mg total) by mouth at bedtime.  . pioglitazone (ACTOS) 45 MG tablet Take 45 mg by mouth daily.  . simvastatin (ZOCOR) 5 MG tablet Take 5 mg by mouth daily. Takes Carter the evening  . [DISCONTINUED] pioglitazone (ACTOS) 30 MG tablet Take 1 tablet by mouth daily.     Allergies:   Lisinopril, Codeine, Pravastatin sodium, and Simvastatin   Social History   Socioeconomic History  . Marital status: Married    Spouse name: Not on file  . Number of children: Not on file  . Years of education: Not on file  . Highest education level: Not on file  Occupational History  . Not on file  Tobacco Use  . Smoking status: Former Smoker    Types: Cigars    Quit date: 10/02/1970    Years since quitting: 48.9  . Smokeless tobacco: Current User    Types: Chew  . Tobacco comment: per pt occasionally chews tobacco  Substance and Sexual Activity  . Alcohol use: Yes    Comment: RARELY   . Drug use: No  . Sexual activity: Not on file  Other Topics Concern  . Not on file  Social History Narrative  . Not on file   Social Determinants of Health   Financial Resource Strain:   . Difficulty  of Paying Living Expenses:   Food Insecurity:   . Worried About Charity fundraiser Carter the Last Year:   . Arboriculturist Carter the Last Year:   Transportation Needs:   . Film/video editor (Medical):   Marland Kitchen Lack of Transportation (Non-Medical):   Physical Activity:   . Days of Exercise per Week:   . Minutes of Exercise per Session:   Stress:   . Feeling of Stress :   Social Connections:   . Frequency of Communication with Friends and Family:   . Frequency of Social Gatherings with Friends and Family:   . Attends Religious Services:   .  Active Member of Clubs or Organizations:   . Attends Archivist Meetings:   Marland Kitchen Marital Status:      Family History: The patient's family history includes Colon cancer Carter his father. There is no history of Stomach cancer.  ROS:   Please see the history of present illness.    He has sleep apnea but does not use CPAP regularly.  Has some difficulty sleeping.  More difficulty falling off to sleep than he does with waking up or having to arise for urination.  Diabetes is not well controlled and he is on no cardioprotective diabetic therapy.  We will recommended Dr. Lisbeth Ply that she consider a GLP 1 agonist or an SGLT2 inhibitor.   All other systems reviewed and are negative.  EKGs/Labs/Other Studies Reviewed:    The following studies were reviewed today: No new or recent cardiac imaging  EKG:  EKG sinus bradycardia, right bundle branch block with left axis deviation, inferior Q waves, and when compared to March 2020, no significant changes noted.  Recent Labs: 11/16/2018: ALT 29  Recent Lipid Panel    Component Value Date/Time   CHOL 164 11/16/2018 0947   TRIG 182 (H) 11/16/2018 0947   HDL 42 11/16/2018 0947   CHOLHDL 3.9 11/16/2018 0947   LDLCALC 86 11/16/2018 0947    Physical Exam:    VS:  BP 106/62   Pulse (!) 53   Ht 6' (1.829 m)   Wt 230 lb (104.3 kg)   SpO2 95%   BMI 31.19 kg/m     Wt Readings from Last 3 Encounters:    09/08/19 230 lb (104.3 kg)  08/31/18 233 lb 12.8 oz (106.1 kg)  03/04/18 228 lb 9.6 oz (103.7 kg)     GEN: Overweight.  Compatible with age Carter appearance.. No acute distress HEENT: Normal NECK: No JVD. LYMPHATICS: No lymphadenopathy CARDIAC:  RRR without murmur, gallop, or edema. VASCULAR:  Normal Pulses. No bruits. RESPIRATORY:  Clear to auscultation without rales, wheezing or rhonchi  ABDOMEN: Soft, non-tender, non-distended, No pulsatile mass, MUSCULOSKELETAL: No deformity  SKIN: Warm and dry NEUROLOGIC:  Alert and oriented x 3 PSYCHIATRIC:  Normal affect   ASSESSMENT:    1. Coronary artery disease involving native coronary artery of native heart with angina pectoris ([place])   2. Essential hypertension, benign   3. Mixed hyperlipidemia   4. Controlled type 2 diabetes mellitus with diabetic nephropathy, without long-term current use of insulin ([place])   5. OSA (obstructive sleep apnea)   6. Educated about COVID-19 virus infection    PLAN:    Carter order of problems listed above:  1. Secondary prevention discussed.  Increasing physical activity is encouraged. 2. Excellent blood pressure control including his record from home which is always prior to taking medications each morning.  His blood pressures are consistently 140/80 or less. 3. LDL cholesterol target is less than 70 and most recently was 86 Carter June.  He is on low intensity statin therapy.  Dose adjustment upward to get LDL less than 70 is encouraged. 4. He should be placed on an SGLT2 or a GLP 1 agonist as these agents are cardioprotective.  Please consider discontinuation of Actos and or glimepiride. 5. I encouraged more compliance with CPAP. 6. COVID-19 vaccine has been received.  Social distancing and mask wearing is being encouraged.  Overall education and awareness concerning primary/secondary risk prevention was discussed Carter detail: LDL less than 70, hemoglobin A1c less than 7, blood pressure target less than 130/80  mmHg, >150 minutes of moderate aerobic activity per week, avoidance of smoking, weight control (via diet and exercise), and continued surveillance/management of/for obstructive sleep apnea.    Medication Adjustments/Labs and Tests Ordered: Current medicines are reviewed at length with the patient today.  Concerns regarding medicines are outlined above.  Orders Placed This Encounter  Procedures  . EKG 12-Lead   No orders of the defined types were placed Carter this encounter.   Patient Instructions  Medication Instructions:  Your physician recommends that you continue on your current medications as directed. Please refer to the Current Medication list given to you today.  *If you need a refill on your cardiac medications before your next appointment, please call your pharmacy*   Lab Work: None If you have labs (blood work) drawn today and your tests are completely normal, you will receive your results only by: Marland Kitchen MyChart Message (if you have MyChart) OR . A paper copy Carter the mail If you have any lab test that is abnormal or we need to change your treatment, we will call you to review the results.   Testing/Procedures: None   Follow-Up: At [facility], you and your health needs are our priority.  As part of our continuing mission to provide you with exceptional heart care, we have created designated Provider Care Teams.  These Care Teams include your primary Cardiologist (physician) and Advanced Practice Providers (APPs -  Physician Assistants and Nurse Practitioners) who all work together to provide you with the care you need, when you need it.  We recommend signing up for the patient portal called "MyChart".  Sign up information is provided on this After Visit Summary.  MyChart is used to connect with patients for Virtual Visits (Telemedicine).  Patients are able to view lab/test results, encounter notes, upcoming appointments, etc.  Non-urgent messages can be sent to your provider  as well.   To learn more about what you can do with MyChart, go to [email or web address].    Your next appointment:   12 month(s)  The format for your next appointment:   Carter Person  Provider:   You may see Eric Grooms, Carter or one of the following Advanced Practice Providers on your designated Care Team:    Truitt Merle, NP  Cecilie Kicks, NP  Kathyrn Drown, NP    Other Instructions  Your provider recommends that you maintain 150 minutes per week of moderate aerobic activity.      Signed, Eric Grooms, Carter  09/08/2019 9:47 AM    [place]

## 2019-09-08 ENCOUNTER — Encounter: Payer: Self-pay | Admitting: Interventional Cardiology

## 2019-09-08 ENCOUNTER — Other Ambulatory Visit: Payer: Self-pay

## 2019-09-08 ENCOUNTER — Ambulatory Visit: Payer: Medicare Other | Admitting: Interventional Cardiology

## 2019-09-08 VITALS — BP 106/62 | HR 53 | Ht 72.0 in | Wt 230.0 lb

## 2019-09-08 DIAGNOSIS — G4733 Obstructive sleep apnea (adult) (pediatric): Secondary | ICD-10-CM | POA: Diagnosis not present

## 2019-09-08 DIAGNOSIS — E1121 Type 2 diabetes mellitus with diabetic nephropathy: Secondary | ICD-10-CM

## 2019-09-08 DIAGNOSIS — Z7189 Other specified counseling: Secondary | ICD-10-CM

## 2019-09-08 DIAGNOSIS — I10 Essential (primary) hypertension: Secondary | ICD-10-CM

## 2019-09-08 DIAGNOSIS — E782 Mixed hyperlipidemia: Secondary | ICD-10-CM | POA: Diagnosis not present

## 2019-09-08 DIAGNOSIS — I25119 Atherosclerotic heart disease of native coronary artery with unspecified angina pectoris: Secondary | ICD-10-CM | POA: Diagnosis not present

## 2019-09-08 NOTE — Patient Instructions (Signed)
Medication Instructions:  Your physician recommends that you continue on your current medications as directed. Please refer to the Current Medication list given to you today.  *If you need a refill on your cardiac medications before your next appointment, please call your pharmacy*   Lab Work: None If you have labs (blood work) drawn today and your tests are completely normal, you will receive your results only by: . MyChart Message (if you have MyChart) OR . A paper copy in the mail If you have any lab test that is abnormal or we need to change your treatment, we will call you to review the results.   Testing/Procedures: None   Follow-Up: At [facility], you and your health needs are our priority.  As part of our continuing mission to provide you with exceptional heart care, we have created designated Provider Care Teams.  These Care Teams include your primary Cardiologist (physician) and Advanced Practice Providers (APPs -  Physician Assistants and Nurse Practitioners) who all work together to provide you with the care you need, when you need it.  We recommend signing up for the patient portal called "MyChart".  Sign up information is provided on this After Visit Summary.  MyChart is used to connect with patients for Virtual Visits (Telemedicine).  Patients are able to view lab/test results, encounter notes, upcoming appointments, etc.  Non-urgent messages can be sent to your provider as well.   To learn more about what you can do with MyChart, go to [email or web address].    Your next appointment:   12 month(s)  The format for your next appointment:   In Person  Provider:   You may see Henry W Smith III, MD or one of the following Advanced Practice Providers on your designated Care Team:    Lori Gerhardt, NP  Laura Ingold, NP  Jill McDaniel, NP    Other Instructions  Your provider recommends that you maintain 150 minutes per week of moderate aerobic activity.   

## 2019-09-30 ENCOUNTER — Other Ambulatory Visit: Payer: Self-pay | Admitting: Cardiology

## 2019-11-09 ENCOUNTER — Other Ambulatory Visit: Payer: Self-pay | Admitting: Interventional Cardiology

## 2019-11-29 DIAGNOSIS — E1122 Type 2 diabetes mellitus with diabetic chronic kidney disease: Secondary | ICD-10-CM | POA: Diagnosis not present

## 2019-11-29 DIAGNOSIS — E782 Mixed hyperlipidemia: Secondary | ICD-10-CM | POA: Diagnosis not present

## 2019-11-29 DIAGNOSIS — Z79899 Other long term (current) drug therapy: Secondary | ICD-10-CM | POA: Diagnosis not present

## 2019-12-01 DIAGNOSIS — I10 Essential (primary) hypertension: Secondary | ICD-10-CM | POA: Diagnosis not present

## 2019-12-01 DIAGNOSIS — E1122 Type 2 diabetes mellitus with diabetic chronic kidney disease: Secondary | ICD-10-CM | POA: Diagnosis not present

## 2019-12-01 DIAGNOSIS — N183 Chronic kidney disease, stage 3 unspecified: Secondary | ICD-10-CM | POA: Diagnosis not present

## 2019-12-01 DIAGNOSIS — E1165 Type 2 diabetes mellitus with hyperglycemia: Secondary | ICD-10-CM | POA: Diagnosis not present

## 2019-12-16 ENCOUNTER — Other Ambulatory Visit: Payer: Self-pay | Admitting: Interventional Cardiology

## 2020-04-17 DIAGNOSIS — E1122 Type 2 diabetes mellitus with diabetic chronic kidney disease: Secondary | ICD-10-CM | POA: Diagnosis not present

## 2020-04-17 DIAGNOSIS — E782 Mixed hyperlipidemia: Secondary | ICD-10-CM | POA: Diagnosis not present

## 2020-04-20 DIAGNOSIS — Z9181 History of falling: Secondary | ICD-10-CM | POA: Diagnosis not present

## 2020-04-20 DIAGNOSIS — E1122 Type 2 diabetes mellitus with diabetic chronic kidney disease: Secondary | ICD-10-CM | POA: Diagnosis not present

## 2020-04-20 DIAGNOSIS — N183 Chronic kidney disease, stage 3 unspecified: Secondary | ICD-10-CM | POA: Diagnosis not present

## 2020-04-20 DIAGNOSIS — E1165 Type 2 diabetes mellitus with hyperglycemia: Secondary | ICD-10-CM | POA: Diagnosis not present

## 2020-04-20 DIAGNOSIS — I25118 Atherosclerotic heart disease of native coronary artery with other forms of angina pectoris: Secondary | ICD-10-CM | POA: Diagnosis not present

## 2020-05-24 DIAGNOSIS — D0472 Carcinoma in situ of skin of left lower limb, including hip: Secondary | ICD-10-CM | POA: Diagnosis not present

## 2020-05-24 DIAGNOSIS — D225 Melanocytic nevi of trunk: Secondary | ICD-10-CM | POA: Diagnosis not present

## 2020-05-24 DIAGNOSIS — D692 Other nonthrombocytopenic purpura: Secondary | ICD-10-CM | POA: Diagnosis not present

## 2020-05-24 DIAGNOSIS — L814 Other melanin hyperpigmentation: Secondary | ICD-10-CM | POA: Diagnosis not present

## 2020-05-24 DIAGNOSIS — Z85828 Personal history of other malignant neoplasm of skin: Secondary | ICD-10-CM | POA: Diagnosis not present

## 2020-05-24 DIAGNOSIS — D2262 Melanocytic nevi of left upper limb, including shoulder: Secondary | ICD-10-CM | POA: Diagnosis not present

## 2020-08-21 DIAGNOSIS — E782 Mixed hyperlipidemia: Secondary | ICD-10-CM | POA: Diagnosis not present

## 2020-08-21 DIAGNOSIS — E1122 Type 2 diabetes mellitus with diabetic chronic kidney disease: Secondary | ICD-10-CM | POA: Diagnosis not present

## 2020-08-24 DIAGNOSIS — Z79899 Other long term (current) drug therapy: Secondary | ICD-10-CM | POA: Diagnosis not present

## 2020-08-24 DIAGNOSIS — I25118 Atherosclerotic heart disease of native coronary artery with other forms of angina pectoris: Secondary | ICD-10-CM | POA: Diagnosis not present

## 2020-08-24 DIAGNOSIS — Z9181 History of falling: Secondary | ICD-10-CM | POA: Diagnosis not present

## 2020-08-24 DIAGNOSIS — I10 Essential (primary) hypertension: Secondary | ICD-10-CM | POA: Diagnosis not present

## 2020-08-24 DIAGNOSIS — E1122 Type 2 diabetes mellitus with diabetic chronic kidney disease: Secondary | ICD-10-CM | POA: Diagnosis not present

## 2020-08-24 DIAGNOSIS — Z139 Encounter for screening, unspecified: Secondary | ICD-10-CM | POA: Diagnosis not present

## 2020-08-24 DIAGNOSIS — E1143 Type 2 diabetes mellitus with diabetic autonomic (poly)neuropathy: Secondary | ICD-10-CM | POA: Diagnosis not present

## 2020-08-24 DIAGNOSIS — K219 Gastro-esophageal reflux disease without esophagitis: Secondary | ICD-10-CM | POA: Diagnosis not present

## 2020-08-24 DIAGNOSIS — D696 Thrombocytopenia, unspecified: Secondary | ICD-10-CM | POA: Diagnosis not present

## 2020-08-24 DIAGNOSIS — E782 Mixed hyperlipidemia: Secondary | ICD-10-CM | POA: Diagnosis not present

## 2020-09-05 DIAGNOSIS — H5203 Hypermetropia, bilateral: Secondary | ICD-10-CM | POA: Diagnosis not present

## 2020-09-07 ENCOUNTER — Encounter (INDEPENDENT_AMBULATORY_CARE_PROVIDER_SITE_OTHER): Payer: Medicare Other | Admitting: Ophthalmology

## 2020-09-14 ENCOUNTER — Other Ambulatory Visit: Payer: Self-pay

## 2020-09-14 ENCOUNTER — Encounter (INDEPENDENT_AMBULATORY_CARE_PROVIDER_SITE_OTHER): Payer: Medicare Other | Admitting: Ophthalmology

## 2020-09-14 DIAGNOSIS — H353221 Exudative age-related macular degeneration, left eye, with active choroidal neovascularization: Secondary | ICD-10-CM

## 2020-09-14 DIAGNOSIS — H43813 Vitreous degeneration, bilateral: Secondary | ICD-10-CM

## 2020-09-14 DIAGNOSIS — H35372 Puckering of macula, left eye: Secondary | ICD-10-CM | POA: Diagnosis not present

## 2020-09-14 DIAGNOSIS — H353112 Nonexudative age-related macular degeneration, right eye, intermediate dry stage: Secondary | ICD-10-CM

## 2020-09-26 DIAGNOSIS — L821 Other seborrheic keratosis: Secondary | ICD-10-CM | POA: Diagnosis not present

## 2020-09-26 DIAGNOSIS — Z85828 Personal history of other malignant neoplasm of skin: Secondary | ICD-10-CM | POA: Diagnosis not present

## 2020-10-03 NOTE — Progress Notes (Signed)
Cardiology Office Note:    Date:  10/04/2020   ID:  Eric Carter, DOB Jun 25, 1931, MRN [id number]  PCP:  Leonides Sake, MD  Cardiologist:  Eric Grooms, MD   Referring MD: Alonna Buckler*   Chief Complaint  Patient presents with  . Coronary Artery Disease  . Hyperlipidemia    History of Present Illness:    Eric Carter is a 87 y.o. male with a hx of OSA on CPAP, HTN, HLD, DM and CAD s/p acute anterior STEMI treated with PCI to RCA in 2007 and recent LHC for shortness of breath and fatigue revealing 75% stenosis of pRCA treated with DES by Dr. Ellyn Hack. He also has diffuse disease inthe LAD being treated medically. On DAPT with aspirin and Plavix, statin, ARB and oral hypoglycemics.  He is doing well.  He denies chest pain, orthopnea, PND, palpitations, and syncope.  He has no energy.  When he tries to do things while standing for more than 5 to almost 10 minutes he gets tired and has to sit down.  No associated pain.  No shortness of breath.  No lightheadedness or dizziness.  He does have some lower back discomfort.  He has neuropathy in his feet.  Past Medical History:  Diagnosis Date  . Arthritis    "all over" (04/29/2017)  . BPH (benign prostatic hyperplasia)   . Chronic constipation   . Coronary artery disease cardiologist-  dr Daneen Schick   hx STEMI inferior  in 2007, distla RCA angioplasty  . First degree heart block   . GERD (gastroesophageal reflux disease)   . History of atrial fibrillation without current medication    episode Atrial Fib. during cardiac cath. 02-28-2006 resolved before discharge  . History of urinary retention    08/2010;  08/2016  . Hyperlipidemia   . Hypertension   . OSA on CPAP    per sleetp study 10-08-2015 Moderate with AHI 19/hr now on CPAP  . RBBB (right bundle branch block)   . Skin cancer    "nose; left ear X 2" (04/29/2017)  . STEMI (ST elevation myocardial infarction) ([place]) 02/28/2006    inferior STEMI  s/p  RCA  angioplasty  . Type II diabetes mellitus ([place])   . Wears glasses     Past Surgical History:  Procedure Laterality Date  . CARDIAC CATHETERIZATION  02-28-2006  dr Daneen Schick   PCI distal RCA /  mLAD  50%,  inferior akinesis, develpment of atrial fibrillation during case,  ef 50%  . CARDIOVASCULAR STRESS TEST  09/20/2002   normal nuclear study w/ no ischemia (diaphragmatic attenuation of the inferior wall)/  normal LV function and wall motion, ef 72%  . CATARACT EXTRACTION W/ INTRAOCULAR LENS  IMPLANT, BILATERAL Bilateral 2015  . COLONOSCOPY  last one 07-02-2013  . CORONARY ANGIOPLASTY WITH STENT PLACEMENT    . CORONARY STENT INTERVENTION N/A 04/29/2017   Procedure: CORONARY STENT INTERVENTION;  Surgeon: Leonie Man, MD;  Location: [place] [facility];  Service: Cardiovascular;  Laterality: N/A;  . INTRAVASCULAR PRESSURE WIRE/FFR STUDY N/A 04/29/2017   Procedure: INTRAVASCULAR PRESSURE WIRE/FFR STUDY;  Surgeon: Leonie Man, MD;  Location: [facility];  Service: Cardiovascular;  Laterality: N/A;  . LEFT HEART CATH AND CORONARY ANGIOGRAPHY N/A 04/29/2017   Procedure: LEFT HEART CATH AND CORONARY ANGIOGRAPHY;  Surgeon: Leonie Man, MD;  Location: [facility];  Service: Cardiovascular;  Laterality: N/A;  . SKIN CANCER EXCISION     "  nose"  . THULIUM LASER TURP (TRANSURETHRAL RESECTION OF PROSTATE) N/A 10/07/2016   Procedure: THULIUM LASER TURP (TRANSURETHRAL RESECTION OF PROSTATE);  Surgeon: Carolan Clines, MD;  Location: [facility];  Service: Urology;  Laterality: N/A;  . TONSILLECTOMY  child  . TRANSURETHRAL RESECTION OF PROSTATE N/A 11/07/2016   Procedure: SECOND STAGE TRANSURETHRAL RESECTION OF THE PROSTATE (TURP);  Surgeon: Carolan Clines, MD;  Location: [facility];  Service: Urology;  Laterality: N/A;    Current Medications: Current Meds  Medication Sig  . ACCU-CHEK GUIDE test strip USE 1 (ONE) STRIP IN VITRO DAILY (**VERIFY METER WITH  PATIENT BEFORE FILLING)  . Accu-Chek Softclix Lancets lancets 2 (two) times daily.  . Blood Glucose Monitoring Suppl (ACCU-CHEK GUIDE ME) w/Device KIT USE 1 (ONE) KIT TO CHECK FASTING BLOOD GLUCOSE EACH AM AND PM  . clopidogrel (PLAVIX) 75 MG tablet TAKE 1 TABLET BY MOUTH EVERY DAY WITH BREAKFAST  . Cyanocobalamin (VITAMIN B 12 PO) Take 1,000 mcg by mouth daily.  Marland Kitchen ezetimibe (ZETIA) 10 MG tablet TAKE 1 TABLET BY MOUTH EVERY DAY  . glimepiride (AMARYL) 4 MG tablet Take 4 mg by mouth 2 (two) times daily.  . isosorbide mononitrate (IMDUR) 30 MG 24 hr tablet TAKE 2 TABLETS BY MOUTH DAILY  . JARDIANCE 25 MG TABS tablet Take 25 mg by mouth daily.  . metFORMIN (GLUCOPHAGE-XR) 500 MG 24 hr tablet Take 2,000 mg by mouth daily.  . pantoprazole (PROTONIX) 40 MG tablet Take 1 tablet (40 mg total) by mouth at bedtime.  . simvastatin (ZOCOR) 5 MG tablet Take 5 mg by mouth daily. Takes in the evening     Allergies:   Lisinopril, Codeine, Pravastatin sodium, and Simvastatin   Social History   Socioeconomic History  . Marital status: Married    Spouse name: Not on file  . Number of children: Not on file  . Years of education: Not on file  . Highest education level: Not on file  Occupational History  . Not on file  Tobacco Use  . Smoking status: Former Smoker    Types: Cigars    Quit date: 10/02/1970    Years since quitting: 50.0  . Smokeless tobacco: Current User    Types: Chew  . Tobacco comment: per pt occasionally chews tobacco  Vaping Use  . Vaping Use: Never used  Substance and Sexual Activity  . Alcohol use: Yes    Comment: RARELY   . Drug use: No  . Sexual activity: Not on file  Other Topics Concern  . Not on file  Social History Narrative  . Not on file   Social Determinants of Health   Financial Resource Strain: Not on file  Food Insecurity: Not on file  Transportation Needs: Not on file  Physical Activity: Not on file  Stress: Not on file  Social Connections: Not on file      Family History: The patient's family history includes Colon cancer in his father. There is no history of Stomach cancer.  ROS:   Please see the history of present illness.    Numb feet.  Decreased energy.  Low back discomfort.  All other systems reviewed and are negative.  EKGs/Labs/Other Studies Reviewed:    The following studies were reviewed today: No new or recent imaging  EKG:  EKG normal sinus rhythm, first-degree AV block 240 ms, old inferior infarct, right bundle branch block.  Q waves V5 and V6.  When compared to the prior tracing performed in March 2021, no  changes noted.  Recent Labs: No results found for requested labs within last 8760 hours.  Recent Lipid Panel    Component Value Date/Time   CHOL 164 11/16/2018 0947   TRIG 182 (H) 11/16/2018 0947   HDL 42 11/16/2018 0947   CHOLHDL 3.9 11/16/2018 0947   LDLCALC 86 11/16/2018 0947    Physical Exam:    VS:  BP 120/60   Pulse 63   Ht 6' (1.829 m)   Wt 220 lb 12.8 oz (100.2 kg)   SpO2 93%   BMI 29.95 kg/m     Wt Readings from Last 3 Encounters:  10/04/20 220 lb 12.8 oz (100.2 kg)  09/08/19 230 lb (104.3 kg)  08/31/18 233 lb 12.8 oz (106.1 kg)     GEN: Overweight, appearing younger than stated age.. No acute distress HEENT: Normal NECK: No JVD. LYMPHATICS: No lymphadenopathy CARDIAC: No murmur. RRR no gallop, or edema. VASCULAR:  Normal Pulses. No bruits. RESPIRATORY:  Clear to auscultation without rales, wheezing or rhonchi  ABDOMEN: Soft, non-tender, non-distended, No pulsatile mass, MUSCULOSKELETAL: No deformity  SKIN: Warm and dry NEUROLOGIC:  Alert and oriented x 3 PSYCHIATRIC:  Normal affect   ASSESSMENT:    1. Coronary artery disease involving native coronary artery of native heart with angina pectoris ([place])   2. Essential hypertension, benign   3. Mixed hyperlipidemia   4. Controlled type 2 diabetes mellitus with diabetic nephropathy, without long-term current use of insulin ([place])    5. OSA (obstructive sleep apnea)    PLAN:    In order of problems listed above:  1. Secondary prevention reviewed.  Encouraged aerobic activity.  Encouraged to notify us if chest discomfort or other cardiac symptoms.  Continue Imdur. 2. Excellent blood pressure control on current regimen which has no specific antihypertensive agent.  He does use Imdur. 3. Continue Zocor 5 mg/day 4. Continue Amaryl.  Last hemoglobin A1c was 7.4 last month.  We discussed cutting back on carbohydrates in his diet and trying to get more physical activity.  This will be difficult given fatigue in his legs. 5. Use CPAP.  Overall education and awareness concerning secondary risk prevention was discussed in detail: LDL less than 70, hemoglobin A1c less than 7, blood pressure target less than 130/80 mmHg, >150 minutes of moderate aerobic activity per week, avoidance of smoking, weight control (via diet and exercise), and continued surveillance/management of/for obstructive sleep apnea.      Medication Adjustments/Labs and Tests Ordered: Current medicines are reviewed at length with the patient today.  Concerns regarding medicines are outlined above.  Orders Placed This Encounter  Procedures  . EKG 12-Lead   No orders of the defined types were placed in this encounter.   Patient Instructions  Medication Instructions:  Your physician recommends that you continue on your current medications as directed. Please refer to the Current Medication list given to you today.  *If you need a refill on your cardiac medications before your next appointment, please call your pharmacy*   Lab Work: None If you have labs (blood work) drawn today and your tests are completely normal, you will receive your results only by: Marland Kitchen MyChart Message (if you have MyChart) OR . A paper copy in the mail If you have any lab test that is abnormal or we need to change your treatment, we will call you to review the  results.   Testing/Procedures: None   Follow-Up: At [facility], you and your health needs are our priority.  As  part of our continuing mission to provide you with exceptional heart care, we have created designated Provider Care Teams.  These Care Teams include your primary Cardiologist (physician) and Advanced Practice Providers (APPs -  Physician Assistants and Nurse Practitioners) who all work together to provide you with the care you need, when you need it.  We recommend signing up for the patient portal called "MyChart".  Sign up information is provided on this After Visit Summary.  MyChart is used to connect with patients for Virtual Visits (Telemedicine).  Patients are able to view lab/test results, encounter notes, upcoming appointments, etc.  Non-urgent messages can be sent to your provider as well.   To learn more about what you can do with MyChart, go to [email or web address].    Your next appointment:   1 year(s)  The format for your next appointment:   In Person  Provider:   You may see Eric Grooms, MD or one of the following Advanced Practice Providers on your designated Care Team:    Kathyrn Drown, NP    Other Instructions      Signed, Eric Grooms, MD  10/04/2020 3:33 PM    [place]

## 2020-10-04 ENCOUNTER — Encounter: Payer: Self-pay | Admitting: Interventional Cardiology

## 2020-10-04 ENCOUNTER — Other Ambulatory Visit: Payer: Self-pay

## 2020-10-04 ENCOUNTER — Ambulatory Visit: Payer: Medicare Other | Admitting: Interventional Cardiology

## 2020-10-04 VITALS — BP 120/60 | HR 63 | Ht 72.0 in | Wt 220.8 lb

## 2020-10-04 DIAGNOSIS — G4733 Obstructive sleep apnea (adult) (pediatric): Secondary | ICD-10-CM

## 2020-10-04 DIAGNOSIS — I10 Essential (primary) hypertension: Secondary | ICD-10-CM | POA: Diagnosis not present

## 2020-10-04 DIAGNOSIS — E1121 Type 2 diabetes mellitus with diabetic nephropathy: Secondary | ICD-10-CM

## 2020-10-04 DIAGNOSIS — I25119 Atherosclerotic heart disease of native coronary artery with unspecified angina pectoris: Secondary | ICD-10-CM

## 2020-10-04 DIAGNOSIS — E782 Mixed hyperlipidemia: Secondary | ICD-10-CM

## 2020-10-04 NOTE — Patient Instructions (Signed)

## 2020-10-12 ENCOUNTER — Other Ambulatory Visit: Payer: Self-pay

## 2020-10-12 ENCOUNTER — Encounter (INDEPENDENT_AMBULATORY_CARE_PROVIDER_SITE_OTHER): Payer: Medicare Other | Admitting: Ophthalmology

## 2020-10-12 DIAGNOSIS — H43813 Vitreous degeneration, bilateral: Secondary | ICD-10-CM

## 2020-10-12 DIAGNOSIS — H353221 Exudative age-related macular degeneration, left eye, with active choroidal neovascularization: Secondary | ICD-10-CM | POA: Diagnosis not present

## 2020-10-12 DIAGNOSIS — H35372 Puckering of macula, left eye: Secondary | ICD-10-CM | POA: Diagnosis not present

## 2020-10-12 DIAGNOSIS — H353111 Nonexudative age-related macular degeneration, right eye, early dry stage: Secondary | ICD-10-CM | POA: Diagnosis not present

## 2020-11-08 ENCOUNTER — Other Ambulatory Visit: Payer: Self-pay

## 2020-11-08 ENCOUNTER — Encounter (INDEPENDENT_AMBULATORY_CARE_PROVIDER_SITE_OTHER): Payer: Medicare Other | Admitting: Ophthalmology

## 2020-11-08 DIAGNOSIS — H353221 Exudative age-related macular degeneration, left eye, with active choroidal neovascularization: Secondary | ICD-10-CM

## 2020-11-08 DIAGNOSIS — H353111 Nonexudative age-related macular degeneration, right eye, early dry stage: Secondary | ICD-10-CM

## 2020-11-08 DIAGNOSIS — H43813 Vitreous degeneration, bilateral: Secondary | ICD-10-CM | POA: Diagnosis not present

## 2020-12-06 ENCOUNTER — Encounter (INDEPENDENT_AMBULATORY_CARE_PROVIDER_SITE_OTHER): Payer: Medicare Other | Admitting: Ophthalmology

## 2020-12-06 ENCOUNTER — Other Ambulatory Visit: Payer: Self-pay

## 2020-12-06 DIAGNOSIS — H353221 Exudative age-related macular degeneration, left eye, with active choroidal neovascularization: Secondary | ICD-10-CM | POA: Diagnosis not present

## 2020-12-06 DIAGNOSIS — H35372 Puckering of macula, left eye: Secondary | ICD-10-CM | POA: Diagnosis not present

## 2020-12-06 DIAGNOSIS — H43813 Vitreous degeneration, bilateral: Secondary | ICD-10-CM | POA: Diagnosis not present

## 2020-12-06 DIAGNOSIS — H353112 Nonexudative age-related macular degeneration, right eye, intermediate dry stage: Secondary | ICD-10-CM

## 2021-01-02 DIAGNOSIS — E782 Mixed hyperlipidemia: Secondary | ICD-10-CM | POA: Diagnosis not present

## 2021-01-04 ENCOUNTER — Other Ambulatory Visit: Payer: Self-pay

## 2021-01-04 ENCOUNTER — Encounter (INDEPENDENT_AMBULATORY_CARE_PROVIDER_SITE_OTHER): Payer: Medicare Other | Admitting: Ophthalmology

## 2021-01-04 DIAGNOSIS — H35372 Puckering of macula, left eye: Secondary | ICD-10-CM

## 2021-01-04 DIAGNOSIS — H353111 Nonexudative age-related macular degeneration, right eye, early dry stage: Secondary | ICD-10-CM | POA: Diagnosis not present

## 2021-01-04 DIAGNOSIS — H43813 Vitreous degeneration, bilateral: Secondary | ICD-10-CM

## 2021-01-04 DIAGNOSIS — H353221 Exudative age-related macular degeneration, left eye, with active choroidal neovascularization: Secondary | ICD-10-CM | POA: Diagnosis not present

## 2021-01-05 DIAGNOSIS — R809 Proteinuria, unspecified: Secondary | ICD-10-CM | POA: Diagnosis not present

## 2021-01-05 DIAGNOSIS — D696 Thrombocytopenia, unspecified: Secondary | ICD-10-CM | POA: Diagnosis not present

## 2021-01-05 DIAGNOSIS — E782 Mixed hyperlipidemia: Secondary | ICD-10-CM | POA: Diagnosis not present

## 2021-01-05 DIAGNOSIS — E1129 Type 2 diabetes mellitus with other diabetic kidney complication: Secondary | ICD-10-CM | POA: Diagnosis not present

## 2021-01-05 DIAGNOSIS — E1143 Type 2 diabetes mellitus with diabetic autonomic (poly)neuropathy: Secondary | ICD-10-CM | POA: Diagnosis not present

## 2021-01-05 DIAGNOSIS — K219 Gastro-esophageal reflux disease without esophagitis: Secondary | ICD-10-CM | POA: Diagnosis not present

## 2021-01-05 DIAGNOSIS — N182 Chronic kidney disease, stage 2 (mild): Secondary | ICD-10-CM | POA: Diagnosis not present

## 2021-01-05 DIAGNOSIS — Z9181 History of falling: Secondary | ICD-10-CM | POA: Diagnosis not present

## 2021-02-01 ENCOUNTER — Other Ambulatory Visit: Payer: Self-pay

## 2021-02-01 ENCOUNTER — Encounter (INDEPENDENT_AMBULATORY_CARE_PROVIDER_SITE_OTHER): Payer: Medicare Other | Admitting: Ophthalmology

## 2021-02-01 DIAGNOSIS — H43813 Vitreous degeneration, bilateral: Secondary | ICD-10-CM | POA: Diagnosis not present

## 2021-02-01 DIAGNOSIS — H353221 Exudative age-related macular degeneration, left eye, with active choroidal neovascularization: Secondary | ICD-10-CM

## 2021-02-01 DIAGNOSIS — H353111 Nonexudative age-related macular degeneration, right eye, early dry stage: Secondary | ICD-10-CM

## 2021-02-01 DIAGNOSIS — H35372 Puckering of macula, left eye: Secondary | ICD-10-CM | POA: Diagnosis not present

## 2021-02-01 DIAGNOSIS — I10 Essential (primary) hypertension: Secondary | ICD-10-CM

## 2021-03-01 ENCOUNTER — Encounter (INDEPENDENT_AMBULATORY_CARE_PROVIDER_SITE_OTHER): Payer: Medicare Other | Admitting: Ophthalmology

## 2021-03-01 ENCOUNTER — Other Ambulatory Visit: Payer: Self-pay

## 2021-03-01 DIAGNOSIS — H353221 Exudative age-related macular degeneration, left eye, with active choroidal neovascularization: Secondary | ICD-10-CM | POA: Diagnosis not present

## 2021-03-01 DIAGNOSIS — H35372 Puckering of macula, left eye: Secondary | ICD-10-CM

## 2021-03-01 DIAGNOSIS — H353111 Nonexudative age-related macular degeneration, right eye, early dry stage: Secondary | ICD-10-CM | POA: Diagnosis not present

## 2021-03-01 DIAGNOSIS — H43813 Vitreous degeneration, bilateral: Secondary | ICD-10-CM | POA: Diagnosis not present

## 2021-03-08 DIAGNOSIS — M25561 Pain in right knee: Secondary | ICD-10-CM | POA: Diagnosis not present

## 2021-03-21 DIAGNOSIS — N182 Chronic kidney disease, stage 2 (mild): Secondary | ICD-10-CM | POA: Diagnosis not present

## 2021-03-21 DIAGNOSIS — Z79899 Other long term (current) drug therapy: Secondary | ICD-10-CM | POA: Diagnosis not present

## 2021-03-21 DIAGNOSIS — E1143 Type 2 diabetes mellitus with diabetic autonomic (poly)neuropathy: Secondary | ICD-10-CM | POA: Diagnosis not present

## 2021-03-21 DIAGNOSIS — E782 Mixed hyperlipidemia: Secondary | ICD-10-CM | POA: Diagnosis not present

## 2021-03-23 DIAGNOSIS — I10 Essential (primary) hypertension: Secondary | ICD-10-CM | POA: Diagnosis not present

## 2021-03-23 DIAGNOSIS — Z23 Encounter for immunization: Secondary | ICD-10-CM | POA: Diagnosis not present

## 2021-03-23 DIAGNOSIS — E1143 Type 2 diabetes mellitus with diabetic autonomic (poly)neuropathy: Secondary | ICD-10-CM | POA: Diagnosis not present

## 2021-03-23 DIAGNOSIS — N182 Chronic kidney disease, stage 2 (mild): Secondary | ICD-10-CM | POA: Diagnosis not present

## 2021-03-23 DIAGNOSIS — E782 Mixed hyperlipidemia: Secondary | ICD-10-CM | POA: Diagnosis not present

## 2021-03-29 ENCOUNTER — Other Ambulatory Visit: Payer: Self-pay

## 2021-03-29 ENCOUNTER — Encounter (INDEPENDENT_AMBULATORY_CARE_PROVIDER_SITE_OTHER): Payer: Medicare Other | Admitting: Ophthalmology

## 2021-03-29 DIAGNOSIS — H353221 Exudative age-related macular degeneration, left eye, with active choroidal neovascularization: Secondary | ICD-10-CM

## 2021-03-29 DIAGNOSIS — H353111 Nonexudative age-related macular degeneration, right eye, early dry stage: Secondary | ICD-10-CM

## 2021-03-29 DIAGNOSIS — H43813 Vitreous degeneration, bilateral: Secondary | ICD-10-CM

## 2021-03-29 DIAGNOSIS — H35372 Puckering of macula, left eye: Secondary | ICD-10-CM | POA: Diagnosis not present

## 2021-04-26 ENCOUNTER — Encounter (INDEPENDENT_AMBULATORY_CARE_PROVIDER_SITE_OTHER): Payer: Medicare Other | Admitting: Ophthalmology

## 2021-04-26 ENCOUNTER — Other Ambulatory Visit: Payer: Self-pay

## 2021-04-26 DIAGNOSIS — H35372 Puckering of macula, left eye: Secondary | ICD-10-CM

## 2021-04-26 DIAGNOSIS — H43813 Vitreous degeneration, bilateral: Secondary | ICD-10-CM

## 2021-04-26 DIAGNOSIS — H353111 Nonexudative age-related macular degeneration, right eye, early dry stage: Secondary | ICD-10-CM

## 2021-04-26 DIAGNOSIS — H353221 Exudative age-related macular degeneration, left eye, with active choroidal neovascularization: Secondary | ICD-10-CM | POA: Diagnosis not present

## 2021-05-04 DIAGNOSIS — L570 Actinic keratosis: Secondary | ICD-10-CM | POA: Diagnosis not present

## 2021-05-04 DIAGNOSIS — L821 Other seborrheic keratosis: Secondary | ICD-10-CM | POA: Diagnosis not present

## 2021-05-04 DIAGNOSIS — D2261 Melanocytic nevi of right upper limb, including shoulder: Secondary | ICD-10-CM | POA: Diagnosis not present

## 2021-05-04 DIAGNOSIS — D225 Melanocytic nevi of trunk: Secondary | ICD-10-CM | POA: Diagnosis not present

## 2021-05-04 DIAGNOSIS — Z85828 Personal history of other malignant neoplasm of skin: Secondary | ICD-10-CM | POA: Diagnosis not present

## 2021-05-04 DIAGNOSIS — D1801 Hemangioma of skin and subcutaneous tissue: Secondary | ICD-10-CM | POA: Diagnosis not present

## 2021-05-04 DIAGNOSIS — L853 Xerosis cutis: Secondary | ICD-10-CM | POA: Diagnosis not present

## 2021-05-23 ENCOUNTER — Encounter (INDEPENDENT_AMBULATORY_CARE_PROVIDER_SITE_OTHER): Payer: Medicare Other | Admitting: Ophthalmology

## 2021-05-23 ENCOUNTER — Other Ambulatory Visit: Payer: Self-pay

## 2021-05-23 DIAGNOSIS — H35371 Puckering of macula, right eye: Secondary | ICD-10-CM | POA: Diagnosis not present

## 2021-05-23 DIAGNOSIS — H353111 Nonexudative age-related macular degeneration, right eye, early dry stage: Secondary | ICD-10-CM | POA: Diagnosis not present

## 2021-05-23 DIAGNOSIS — H43813 Vitreous degeneration, bilateral: Secondary | ICD-10-CM

## 2021-05-23 DIAGNOSIS — H353221 Exudative age-related macular degeneration, left eye, with active choroidal neovascularization: Secondary | ICD-10-CM

## 2021-06-21 ENCOUNTER — Other Ambulatory Visit: Payer: Self-pay

## 2021-06-21 ENCOUNTER — Encounter (INDEPENDENT_AMBULATORY_CARE_PROVIDER_SITE_OTHER): Payer: Medicare Other | Admitting: Ophthalmology

## 2021-06-21 DIAGNOSIS — H353111 Nonexudative age-related macular degeneration, right eye, early dry stage: Secondary | ICD-10-CM | POA: Diagnosis not present

## 2021-06-21 DIAGNOSIS — H43813 Vitreous degeneration, bilateral: Secondary | ICD-10-CM

## 2021-06-21 DIAGNOSIS — H353221 Exudative age-related macular degeneration, left eye, with active choroidal neovascularization: Secondary | ICD-10-CM

## 2021-06-21 DIAGNOSIS — H35372 Puckering of macula, left eye: Secondary | ICD-10-CM

## 2021-06-25 DIAGNOSIS — E782 Mixed hyperlipidemia: Secondary | ICD-10-CM | POA: Diagnosis not present

## 2021-06-25 DIAGNOSIS — E1143 Type 2 diabetes mellitus with diabetic autonomic (poly)neuropathy: Secondary | ICD-10-CM | POA: Diagnosis not present

## 2021-07-04 DIAGNOSIS — N1831 Chronic kidney disease, stage 3a: Secondary | ICD-10-CM | POA: Diagnosis not present

## 2021-07-04 DIAGNOSIS — I25118 Atherosclerotic heart disease of native coronary artery with other forms of angina pectoris: Secondary | ICD-10-CM | POA: Diagnosis not present

## 2021-07-04 DIAGNOSIS — E1143 Type 2 diabetes mellitus with diabetic autonomic (poly)neuropathy: Secondary | ICD-10-CM | POA: Diagnosis not present

## 2021-07-04 DIAGNOSIS — D696 Thrombocytopenia, unspecified: Secondary | ICD-10-CM | POA: Diagnosis not present

## 2021-07-04 DIAGNOSIS — I10 Essential (primary) hypertension: Secondary | ICD-10-CM | POA: Diagnosis not present

## 2021-07-04 DIAGNOSIS — E782 Mixed hyperlipidemia: Secondary | ICD-10-CM | POA: Diagnosis not present

## 2021-07-04 DIAGNOSIS — K219 Gastro-esophageal reflux disease without esophagitis: Secondary | ICD-10-CM | POA: Diagnosis not present

## 2021-07-19 ENCOUNTER — Encounter (INDEPENDENT_AMBULATORY_CARE_PROVIDER_SITE_OTHER): Payer: Medicare Other | Admitting: Ophthalmology

## 2021-07-19 ENCOUNTER — Other Ambulatory Visit: Payer: Self-pay

## 2021-07-19 DIAGNOSIS — H43813 Vitreous degeneration, bilateral: Secondary | ICD-10-CM

## 2021-07-19 DIAGNOSIS — H353111 Nonexudative age-related macular degeneration, right eye, early dry stage: Secondary | ICD-10-CM

## 2021-07-19 DIAGNOSIS — H353221 Exudative age-related macular degeneration, left eye, with active choroidal neovascularization: Secondary | ICD-10-CM | POA: Diagnosis not present

## 2021-08-17 ENCOUNTER — Encounter (INDEPENDENT_AMBULATORY_CARE_PROVIDER_SITE_OTHER): Payer: Medicare Other | Admitting: Ophthalmology

## 2021-08-17 ENCOUNTER — Other Ambulatory Visit: Payer: Self-pay

## 2021-08-17 DIAGNOSIS — H43813 Vitreous degeneration, bilateral: Secondary | ICD-10-CM | POA: Diagnosis not present

## 2021-08-17 DIAGNOSIS — H353112 Nonexudative age-related macular degeneration, right eye, intermediate dry stage: Secondary | ICD-10-CM | POA: Diagnosis not present

## 2021-08-17 DIAGNOSIS — H353221 Exudative age-related macular degeneration, left eye, with active choroidal neovascularization: Secondary | ICD-10-CM

## 2021-08-17 DIAGNOSIS — H35372 Puckering of macula, left eye: Secondary | ICD-10-CM

## 2021-09-11 DIAGNOSIS — H353222 Exudative age-related macular degeneration, left eye, with inactive choroidal neovascularization: Secondary | ICD-10-CM | POA: Diagnosis not present

## 2021-09-11 DIAGNOSIS — H5203 Hypermetropia, bilateral: Secondary | ICD-10-CM | POA: Diagnosis not present

## 2021-09-11 DIAGNOSIS — H353292 Exudative age-related macular degeneration, unspecified eye, with inactive choroidal neovascularization: Secondary | ICD-10-CM | POA: Diagnosis not present

## 2021-09-11 DIAGNOSIS — H3589 Other specified retinal disorders: Secondary | ICD-10-CM | POA: Diagnosis not present

## 2021-09-11 DIAGNOSIS — E119 Type 2 diabetes mellitus without complications: Secondary | ICD-10-CM | POA: Diagnosis not present

## 2021-09-14 ENCOUNTER — Encounter (INDEPENDENT_AMBULATORY_CARE_PROVIDER_SITE_OTHER): Payer: Medicare Other | Admitting: Ophthalmology

## 2021-09-14 DIAGNOSIS — H43813 Vitreous degeneration, bilateral: Secondary | ICD-10-CM | POA: Diagnosis not present

## 2021-09-14 DIAGNOSIS — H353221 Exudative age-related macular degeneration, left eye, with active choroidal neovascularization: Secondary | ICD-10-CM | POA: Diagnosis not present

## 2021-09-14 DIAGNOSIS — H353111 Nonexudative age-related macular degeneration, right eye, early dry stage: Secondary | ICD-10-CM

## 2021-10-02 DIAGNOSIS — E782 Mixed hyperlipidemia: Secondary | ICD-10-CM | POA: Diagnosis not present

## 2021-10-02 DIAGNOSIS — E1143 Type 2 diabetes mellitus with diabetic autonomic (poly)neuropathy: Secondary | ICD-10-CM | POA: Diagnosis not present

## 2021-10-04 DIAGNOSIS — I25118 Atherosclerotic heart disease of native coronary artery with other forms of angina pectoris: Secondary | ICD-10-CM | POA: Diagnosis not present

## 2021-10-04 DIAGNOSIS — K219 Gastro-esophageal reflux disease without esophagitis: Secondary | ICD-10-CM | POA: Diagnosis not present

## 2021-10-04 DIAGNOSIS — N182 Chronic kidney disease, stage 2 (mild): Secondary | ICD-10-CM | POA: Diagnosis not present

## 2021-10-04 DIAGNOSIS — Z139 Encounter for screening, unspecified: Secondary | ICD-10-CM | POA: Diagnosis not present

## 2021-10-04 DIAGNOSIS — E782 Mixed hyperlipidemia: Secondary | ICD-10-CM | POA: Diagnosis not present

## 2021-10-04 DIAGNOSIS — E1143 Type 2 diabetes mellitus with diabetic autonomic (poly)neuropathy: Secondary | ICD-10-CM | POA: Diagnosis not present

## 2021-10-04 DIAGNOSIS — D696 Thrombocytopenia, unspecified: Secondary | ICD-10-CM | POA: Diagnosis not present

## 2021-10-04 DIAGNOSIS — I10 Essential (primary) hypertension: Secondary | ICD-10-CM | POA: Diagnosis not present

## 2021-10-12 ENCOUNTER — Encounter (INDEPENDENT_AMBULATORY_CARE_PROVIDER_SITE_OTHER): Payer: Medicare Other | Admitting: Ophthalmology

## 2021-10-12 DIAGNOSIS — I10 Essential (primary) hypertension: Secondary | ICD-10-CM

## 2021-10-12 DIAGNOSIS — H353111 Nonexudative age-related macular degeneration, right eye, early dry stage: Secondary | ICD-10-CM

## 2021-10-12 DIAGNOSIS — H43813 Vitreous degeneration, bilateral: Secondary | ICD-10-CM

## 2021-10-12 DIAGNOSIS — H353221 Exudative age-related macular degeneration, left eye, with active choroidal neovascularization: Secondary | ICD-10-CM | POA: Diagnosis not present

## 2021-10-12 DIAGNOSIS — H35033 Hypertensive retinopathy, bilateral: Secondary | ICD-10-CM

## 2021-11-16 ENCOUNTER — Encounter (INDEPENDENT_AMBULATORY_CARE_PROVIDER_SITE_OTHER): Payer: Medicare Other | Admitting: Ophthalmology

## 2021-11-16 DIAGNOSIS — H353112 Nonexudative age-related macular degeneration, right eye, intermediate dry stage: Secondary | ICD-10-CM | POA: Diagnosis not present

## 2021-11-16 DIAGNOSIS — H43813 Vitreous degeneration, bilateral: Secondary | ICD-10-CM | POA: Diagnosis not present

## 2021-11-16 DIAGNOSIS — H35372 Puckering of macula, left eye: Secondary | ICD-10-CM

## 2021-11-16 DIAGNOSIS — H353221 Exudative age-related macular degeneration, left eye, with active choroidal neovascularization: Secondary | ICD-10-CM

## 2021-12-10 DIAGNOSIS — Z0000 Encounter for general adult medical examination without abnormal findings: Secondary | ICD-10-CM | POA: Diagnosis not present

## 2021-12-10 DIAGNOSIS — Z9181 History of falling: Secondary | ICD-10-CM | POA: Diagnosis not present

## 2021-12-10 DIAGNOSIS — E785 Hyperlipidemia, unspecified: Secondary | ICD-10-CM | POA: Diagnosis not present

## 2021-12-14 ENCOUNTER — Encounter (INDEPENDENT_AMBULATORY_CARE_PROVIDER_SITE_OTHER): Payer: Medicare Other | Admitting: Ophthalmology

## 2021-12-14 DIAGNOSIS — H35372 Puckering of macula, left eye: Secondary | ICD-10-CM

## 2021-12-14 DIAGNOSIS — H353112 Nonexudative age-related macular degeneration, right eye, intermediate dry stage: Secondary | ICD-10-CM | POA: Diagnosis not present

## 2021-12-14 DIAGNOSIS — H353221 Exudative age-related macular degeneration, left eye, with active choroidal neovascularization: Secondary | ICD-10-CM | POA: Diagnosis not present

## 2021-12-14 DIAGNOSIS — H43813 Vitreous degeneration, bilateral: Secondary | ICD-10-CM | POA: Diagnosis not present

## 2022-01-01 DIAGNOSIS — E1143 Type 2 diabetes mellitus with diabetic autonomic (poly)neuropathy: Secondary | ICD-10-CM | POA: Diagnosis not present

## 2022-01-01 DIAGNOSIS — E782 Mixed hyperlipidemia: Secondary | ICD-10-CM | POA: Diagnosis not present

## 2022-01-03 DIAGNOSIS — D696 Thrombocytopenia, unspecified: Secondary | ICD-10-CM | POA: Diagnosis not present

## 2022-01-03 DIAGNOSIS — I25118 Atherosclerotic heart disease of native coronary artery with other forms of angina pectoris: Secondary | ICD-10-CM | POA: Diagnosis not present

## 2022-01-03 DIAGNOSIS — K219 Gastro-esophageal reflux disease without esophagitis: Secondary | ICD-10-CM | POA: Diagnosis not present

## 2022-01-03 DIAGNOSIS — E782 Mixed hyperlipidemia: Secondary | ICD-10-CM | POA: Diagnosis not present

## 2022-01-03 DIAGNOSIS — E1143 Type 2 diabetes mellitus with diabetic autonomic (poly)neuropathy: Secondary | ICD-10-CM | POA: Diagnosis not present

## 2022-01-03 DIAGNOSIS — D692 Other nonthrombocytopenic purpura: Secondary | ICD-10-CM | POA: Diagnosis not present

## 2022-01-03 DIAGNOSIS — N182 Chronic kidney disease, stage 2 (mild): Secondary | ICD-10-CM | POA: Diagnosis not present

## 2022-01-03 DIAGNOSIS — I10 Essential (primary) hypertension: Secondary | ICD-10-CM | POA: Diagnosis not present

## 2022-01-11 ENCOUNTER — Encounter (INDEPENDENT_AMBULATORY_CARE_PROVIDER_SITE_OTHER): Payer: Medicare Other | Admitting: Ophthalmology

## 2022-01-11 DIAGNOSIS — H35372 Puckering of macula, left eye: Secondary | ICD-10-CM | POA: Diagnosis not present

## 2022-01-11 DIAGNOSIS — H353221 Exudative age-related macular degeneration, left eye, with active choroidal neovascularization: Secondary | ICD-10-CM

## 2022-01-11 DIAGNOSIS — H353112 Nonexudative age-related macular degeneration, right eye, intermediate dry stage: Secondary | ICD-10-CM

## 2022-01-11 DIAGNOSIS — H43813 Vitreous degeneration, bilateral: Secondary | ICD-10-CM | POA: Diagnosis not present

## 2022-02-07 ENCOUNTER — Encounter (INDEPENDENT_AMBULATORY_CARE_PROVIDER_SITE_OTHER): Payer: Medicare Other | Admitting: Ophthalmology

## 2022-02-07 DIAGNOSIS — H353221 Exudative age-related macular degeneration, left eye, with active choroidal neovascularization: Secondary | ICD-10-CM

## 2022-02-07 DIAGNOSIS — H353112 Nonexudative age-related macular degeneration, right eye, intermediate dry stage: Secondary | ICD-10-CM

## 2022-02-07 DIAGNOSIS — H35372 Puckering of macula, left eye: Secondary | ICD-10-CM

## 2022-02-07 DIAGNOSIS — H43813 Vitreous degeneration, bilateral: Secondary | ICD-10-CM

## 2022-03-07 ENCOUNTER — Encounter (INDEPENDENT_AMBULATORY_CARE_PROVIDER_SITE_OTHER): Payer: Medicare Other | Admitting: Ophthalmology

## 2022-03-07 DIAGNOSIS — H353221 Exudative age-related macular degeneration, left eye, with active choroidal neovascularization: Secondary | ICD-10-CM | POA: Diagnosis not present

## 2022-03-07 DIAGNOSIS — H353112 Nonexudative age-related macular degeneration, right eye, intermediate dry stage: Secondary | ICD-10-CM

## 2022-03-07 DIAGNOSIS — H35372 Puckering of macula, left eye: Secondary | ICD-10-CM

## 2022-03-07 DIAGNOSIS — H43813 Vitreous degeneration, bilateral: Secondary | ICD-10-CM

## 2022-03-24 NOTE — Progress Notes (Signed)
Cardiology Office Note:    Date:  03/25/2022   ID:  BUBBA Carter, DOB 1931-08-22, MRN [id number]  PCP:  Leonides Sake, MD  Cardiologist:  Sinclair Grooms, MD   Referring MD: Leonides Sake, MD   No chief complaint on file.   History of Present Illness:    Eric Carter is a 88 y.o. male with a hx of OSA on CPAP, HTN, HLD, DM and CAD s/p acute anterior STEMI treated with PCI to RCA in 2007 and recent LHC for shortness of breath and fatigue revealing 75% stenosis of pRCA treated with DES by Dr. Ellyn Hack. He also has diffuse disease in the LAD being treated medically. On DAPT with aspirin and Plavix, statin, ARB and oral hypoglycemics.   Weight is doing well.  He is having a lot of personal family issues: Wife has advancing dementia, his brother-in-law was in the hospital now for greater than 2 weeks, his daughter had a hemorrhagic stroke at age 78 earlier this summer.  These of all weighed heavily on him.  He has had more stress and physical activity on him than he can remember.  Despite these things, he has not had angina, heart failure, palpitations, or other complaints.  He gets somewhat tearful when he talks about his family issues.  He is not getting good sleep.  Past Medical History:  Diagnosis Date   Arthritis    "all over" (04/29/2017)   BPH (benign prostatic hyperplasia)    Chronic constipation    Coronary artery disease cardiologist-  dr Daneen Schick   hx STEMI inferior  in 2007, distla RCA angioplasty   First degree heart block    GERD (gastroesophageal reflux disease)    History of atrial fibrillation without current medication    episode Atrial Fib. during cardiac cath. 02-28-2006 resolved before discharge   History of urinary retention    08/2010;  08/2016   Hyperlipidemia    Hypertension    OSA on CPAP    per sleetp study 10-08-2015 Moderate with AHI 19/hr now on CPAP   RBBB (right bundle branch block)    Skin cancer    "nose; left ear X 2" (04/29/2017)    STEMI (ST elevation myocardial infarction) ([place]) 02/28/2006    inferior STEMI  s/p  RCA angioplasty   Type II diabetes mellitus ([place])    Wears glasses     Past Surgical History:  Procedure Laterality Date   CARDIAC CATHETERIZATION  02-28-2006  dr Daneen Schick   PCI distal RCA /  mLAD  50%,  inferior akinesis, develpment of atrial fibrillation during case,  ef 50%   CARDIOVASCULAR STRESS TEST  09/20/2002   normal nuclear study w/ no ischemia (diaphragmatic attenuation of the inferior wall)/  normal LV function and wall motion, ef 72%   CATARACT EXTRACTION W/ INTRAOCULAR LENS  IMPLANT, BILATERAL Bilateral 2015   COLONOSCOPY  last one 07-02-2013   CORONARY ANGIOPLASTY WITH STENT PLACEMENT     CORONARY STENT INTERVENTION N/A 04/29/2017   Procedure: CORONARY STENT INTERVENTION;  Surgeon: Leonie Man, MD;  Location: [facility];  Service: Cardiovascular;  Laterality: N/A;   INTRAVASCULAR PRESSURE WIRE/FFR STUDY N/A 04/29/2017   Procedure: INTRAVASCULAR PRESSURE WIRE/FFR STUDY;  Surgeon: Leonie Man, MD;  Location: [place] [facility];  Service: Cardiovascular;  Laterality: N/A;   LEFT HEART CATH AND CORONARY ANGIOGRAPHY N/A 04/29/2017   Procedure: LEFT HEART CATH AND CORONARY ANGIOGRAPHY;  Surgeon: Leonie Man, MD;  Location: [place] [facility];  Service: Cardiovascular;  Laterality: N/A;   SKIN CANCER EXCISION     "nose"   THULIUM LASER TURP (TRANSURETHRAL RESECTION OF PROSTATE) N/A 10/07/2016   Procedure: THULIUM LASER TURP (TRANSURETHRAL RESECTION OF PROSTATE);  Surgeon: Carolan Clines, MD;  Location: [facility];  Service: Urology;  Laterality: N/A;   TONSILLECTOMY  child   TRANSURETHRAL RESECTION OF PROSTATE N/A 11/07/2016   Procedure: SECOND STAGE TRANSURETHRAL RESECTION OF THE PROSTATE (TURP);  Surgeon: Carolan Clines, MD;  Location: [facility];  Service: Urology;  Laterality: N/A;    Current Medications: Current Meds  Medication Sig    ACCU-CHEK GUIDE test strip USE 1 (ONE) STRIP IN VITRO DAILY (**VERIFY METER WITH PATIENT BEFORE FILLING)   Accu-Chek Softclix Lancets lancets 2 (two) times daily.   Blood Glucose Monitoring Suppl (ACCU-CHEK GUIDE ME) w/Device KIT USE 1 (ONE) KIT TO CHECK FASTING BLOOD GLUCOSE EACH AM AND PM   clopidogrel (PLAVIX) 75 MG tablet TAKE 1 TABLET BY MOUTH EVERY DAY WITH BREAKFAST   Cyanocobalamin (VITAMIN B 12 PO) Take 1,000 mcg by mouth daily.   ezetimibe (ZETIA) 10 MG tablet TAKE 1 TABLET BY MOUTH EVERY DAY   isosorbide mononitrate (IMDUR) 30 MG 24 hr tablet TAKE 2 TABLETS BY MOUTH DAILY   metFORMIN (GLUCOPHAGE-XR) 500 MG 24 hr tablet Take 2,000 mg by mouth daily.   pantoprazole (PROTONIX) 40 MG tablet Take 1 tablet (40 mg total) by mouth at bedtime.   pioglitazone (ACTOS) 45 MG tablet Take 45 mg by mouth daily.   simvastatin (ZOCOR) 10 MG tablet Take 10 mg by mouth at bedtime.   TRESIBA FLEXTOUCH 200 UNIT/ML FlexTouch Pen Inject 38 Units into the skin daily at 6 (six) AM.   [DISCONTINUED] nitroGLYCERIN (NITROSTAT) 0.4 MG SL tablet Place 1 tablet (0.4 mg total) under the tongue every 5 (five) minutes as needed for chest pain.     Allergies:   Lisinopril, Codeine, Pravastatin sodium, and Simvastatin   Social History   Socioeconomic History   Marital status: Married    Spouse name: Not on file   Number of children: Not on file   Years of education: Not on file   Highest education level: Not on file  Occupational History   Not on file  Tobacco Use   Smoking status: Former    Types: Cigars    Quit date: 10/02/1970    Years since quitting: 51.5   Smokeless tobacco: Current    Types: Chew   Tobacco comments:    per pt occasionally chews tobacco  Vaping Use   Vaping Use: Never used  Substance and Sexual Activity   Alcohol use: Yes    Comment: RARELY    Drug use: No   Sexual activity: Not on file  Other Topics Concern   Not on file  Social History Narrative   Not on file   Social  Determinants of Health   Financial Resource Strain: Not on file  Food Insecurity: Not on file  Transportation Needs: Not on file  Physical Activity: Not on file  Stress: Not on file  Social Connections: Not on file     Family History: The patient's family history includes Colon cancer in his father. There is no history of Stomach cancer.  ROS:   Please see the history of present illness.    Anxiety and depression related to situation and life occurrences as noted above.  All other systems reviewed and are negative.  EKGs/Labs/Other Studies Reviewed:  The following studies were reviewed today: No new imaging  EKG:  EKG sinus bradycardia, inferior infarct with anterolateral extension, left axis deviation, right bundle.  When compared to the prior tracing of April 2022, the EKG is unchanged.  First-degree AV block is comparable.  Recent Labs: No results found for requested labs within last 365 days.  Recent Lipid Panel    Component Value Date/Time   CHOL 164 11/16/2018 0947   TRIG 182 (H) 11/16/2018 0947   HDL 42 11/16/2018 0947   CHOLHDL 3.9 11/16/2018 0947   LDLCALC 86 11/16/2018 0947    Physical Exam:    VS:  BP 118/62   Pulse (!) 57   Ht 6' (1.829 m)   Wt 229 lb (103.9 kg)   SpO2 96%   BMI 31.06 kg/m     Wt Readings from Last 3 Encounters:  03/25/22 229 lb (103.9 kg)  10/04/20 220 lb 12.8 oz (100.2 kg)  09/08/19 230 lb (104.3 kg)     GEN: Obese. No acute distress HEENT: Normal NECK: No JVD. LYMPHATICS: No lymphadenopathy CARDIAC: No murmur. RRR no gallop, or edema. VASCULAR:  normal Pulses. No bruits. RESPIRATORY:  Clear to auscultation without rales, wheezing or rhonchi  ABDOMEN: Soft, non-tender, non-distended, No pulsatile mass, MUSCULOSKELETAL: No deformity  SKIN: Warm and dry NEUROLOGIC:  Alert and oriented x 3 PSYCHIATRIC:  Normal affect   ASSESSMENT:    1. Coronary artery disease involving native coronary artery of native heart with angina  pectoris ([place])   2. Essential hypertension, benign   3. Mixed hyperlipidemia   4. Controlled type 2 diabetes mellitus with diabetic nephropathy, without long-term current use of insulin ([place])   5. OSA (obstructive sleep apnea)   6. Right bundle branch block (RBBB) with left anterior hemiblock    PLAN:    In order of problems listed above:  Secondary prevention reviewed.  LDL is higher than I would like for her to be.  Continue Zetia and simvastatin.  Current LDL 89.  This was done in July.  Encouraged him to cut back on saturated fat. Continue long-term monitoring. Zetia and simvastatin should be continued If shortness of breath, consider Jardiance.  He was on 25 mg at 1 point in the past but the medication has been discontinued, and I am not quite sure why. Continue using CPAP. Unchanged  Prayers for his family were expressed.  He should call us if shortness of breath or chest pain.   Clinical follow-up in 1 year with newly assigned cardiologist.   Medication Adjustments/Labs and Tests Ordered: Current medicines are reviewed at length with the patient today.  Concerns regarding medicines are outlined above.  Orders Placed This Encounter  Procedures   EKG 12-Lead   Meds ordered this encounter  Medications   nitroGLYCERIN (NITROSTAT) 0.4 MG SL tablet    Sig: Place 1 tablet (0.4 mg total) under the tongue every 5 (five) minutes as needed for chest pain.    Dispense:  25 tablet    Refill:  5    Patient Instructions  Medication Instructions:  Your physician recommends that you continue on your current medications as directed. Please refer to the Current Medication list given to you today.  *If you need a refill on your cardiac medications before your next appointment, please call your pharmacy*  Lab Work: NONE  Testing/Procedures: NONE  Follow-Up: At [facility], you and your health needs are our priority.  As part of our continuing mission to provide you  with  exceptional heart care, we have created designated Provider Care Teams.  These Care Teams include your primary Cardiologist (physician) and Advanced Practice Providers (APPs -  Physician Assistants and Nurse Practitioners) who all work together to provide you with the care you need, when you need it.  Your next appointment:   1 year(s)  The format for your next appointment:   In Person  Provider:   Sinclair Grooms, MD   Important Information About Sugar         Signed, Sinclair Grooms, MD  03/25/2022 1:53 PM    [place]

## 2022-03-25 ENCOUNTER — Encounter: Payer: Self-pay | Admitting: Interventional Cardiology

## 2022-03-25 ENCOUNTER — Ambulatory Visit: Payer: Medicare Other | Attending: Interventional Cardiology | Admitting: Interventional Cardiology

## 2022-03-25 VITALS — BP 118/62 | HR 57 | Ht 72.0 in | Wt 229.0 lb

## 2022-03-25 DIAGNOSIS — I452 Bifascicular block: Secondary | ICD-10-CM

## 2022-03-25 DIAGNOSIS — I10 Essential (primary) hypertension: Secondary | ICD-10-CM

## 2022-03-25 DIAGNOSIS — I25119 Atherosclerotic heart disease of native coronary artery with unspecified angina pectoris: Secondary | ICD-10-CM | POA: Diagnosis not present

## 2022-03-25 DIAGNOSIS — E1121 Type 2 diabetes mellitus with diabetic nephropathy: Secondary | ICD-10-CM

## 2022-03-25 DIAGNOSIS — G4733 Obstructive sleep apnea (adult) (pediatric): Secondary | ICD-10-CM | POA: Diagnosis not present

## 2022-03-25 DIAGNOSIS — E782 Mixed hyperlipidemia: Secondary | ICD-10-CM

## 2022-03-25 MED ORDER — NITROGLYCERIN 0.4 MG SL SUBL: 0.4000 mg | SUBLINGUAL_TABLET | SUBLINGUAL | 5 refills | Status: AC | PRN

## 2022-03-25 NOTE — Patient Instructions (Signed)
Medication Instructions:  Your physician recommends that you continue on your current medications as directed. Please refer to the Current Medication list given to you today.  *If you need a refill on your cardiac medications before your next appointment, please call your pharmacy*  Lab Work: NONE  Testing/Procedures: NONE  Follow-Up: At [facility], you and your health needs are our priority.  As part of our continuing mission to provide you with exceptional heart care, we have created designated Provider Care Teams.  These Care Teams include your primary Cardiologist (physician) and Advanced Practice Providers (APPs -  Physician Assistants and Nurse Practitioners) who all work together to provide you with the care you need, when you need it.  Your next appointment:   1 year(s)  The format for your next appointment:   In Person  Provider:   Henry W Smith III, MD    Important Information About Sugar       

## 2022-04-04 ENCOUNTER — Encounter (INDEPENDENT_AMBULATORY_CARE_PROVIDER_SITE_OTHER): Payer: Medicare Other | Admitting: Ophthalmology

## 2022-04-04 DIAGNOSIS — H353221 Exudative age-related macular degeneration, left eye, with active choroidal neovascularization: Secondary | ICD-10-CM | POA: Diagnosis not present

## 2022-04-04 DIAGNOSIS — H35372 Puckering of macula, left eye: Secondary | ICD-10-CM | POA: Diagnosis not present

## 2022-04-04 DIAGNOSIS — H43813 Vitreous degeneration, bilateral: Secondary | ICD-10-CM

## 2022-04-04 DIAGNOSIS — H353112 Nonexudative age-related macular degeneration, right eye, intermediate dry stage: Secondary | ICD-10-CM | POA: Diagnosis not present

## 2022-04-10 DIAGNOSIS — Z79899 Other long term (current) drug therapy: Secondary | ICD-10-CM | POA: Diagnosis not present

## 2022-04-10 DIAGNOSIS — E782 Mixed hyperlipidemia: Secondary | ICD-10-CM | POA: Diagnosis not present

## 2022-04-10 DIAGNOSIS — D696 Thrombocytopenia, unspecified: Secondary | ICD-10-CM | POA: Diagnosis not present

## 2022-04-10 DIAGNOSIS — E1143 Type 2 diabetes mellitus with diabetic autonomic (poly)neuropathy: Secondary | ICD-10-CM | POA: Diagnosis not present

## 2022-04-15 DIAGNOSIS — D696 Thrombocytopenia, unspecified: Secondary | ICD-10-CM | POA: Diagnosis not present

## 2022-04-15 DIAGNOSIS — I25118 Atherosclerotic heart disease of native coronary artery with other forms of angina pectoris: Secondary | ICD-10-CM | POA: Diagnosis not present

## 2022-04-15 DIAGNOSIS — D692 Other nonthrombocytopenic purpura: Secondary | ICD-10-CM | POA: Diagnosis not present

## 2022-04-15 DIAGNOSIS — E782 Mixed hyperlipidemia: Secondary | ICD-10-CM | POA: Diagnosis not present

## 2022-04-15 DIAGNOSIS — N182 Chronic kidney disease, stage 2 (mild): Secondary | ICD-10-CM | POA: Diagnosis not present

## 2022-04-15 DIAGNOSIS — K219 Gastro-esophageal reflux disease without esophagitis: Secondary | ICD-10-CM | POA: Diagnosis not present

## 2022-04-15 DIAGNOSIS — E1143 Type 2 diabetes mellitus with diabetic autonomic (poly)neuropathy: Secondary | ICD-10-CM | POA: Diagnosis not present

## 2022-04-15 DIAGNOSIS — I10 Essential (primary) hypertension: Secondary | ICD-10-CM | POA: Diagnosis not present

## 2022-04-15 DIAGNOSIS — Z23 Encounter for immunization: Secondary | ICD-10-CM | POA: Diagnosis not present

## 2022-05-02 ENCOUNTER — Encounter (INDEPENDENT_AMBULATORY_CARE_PROVIDER_SITE_OTHER): Payer: Medicare Other | Admitting: Ophthalmology

## 2022-05-02 DIAGNOSIS — H353112 Nonexudative age-related macular degeneration, right eye, intermediate dry stage: Secondary | ICD-10-CM

## 2022-05-02 DIAGNOSIS — H353221 Exudative age-related macular degeneration, left eye, with active choroidal neovascularization: Secondary | ICD-10-CM

## 2022-05-02 DIAGNOSIS — H35372 Puckering of macula, left eye: Secondary | ICD-10-CM

## 2022-05-02 DIAGNOSIS — H43813 Vitreous degeneration, bilateral: Secondary | ICD-10-CM | POA: Diagnosis not present

## 2022-05-13 DIAGNOSIS — D1801 Hemangioma of skin and subcutaneous tissue: Secondary | ICD-10-CM | POA: Diagnosis not present

## 2022-05-13 DIAGNOSIS — D2271 Melanocytic nevi of right lower limb, including hip: Secondary | ICD-10-CM | POA: Diagnosis not present

## 2022-05-13 DIAGNOSIS — D0439 Carcinoma in situ of skin of other parts of face: Secondary | ICD-10-CM | POA: Diagnosis not present

## 2022-05-13 DIAGNOSIS — D2262 Melanocytic nevi of left upper limb, including shoulder: Secondary | ICD-10-CM | POA: Diagnosis not present

## 2022-05-13 DIAGNOSIS — D225 Melanocytic nevi of trunk: Secondary | ICD-10-CM | POA: Diagnosis not present

## 2022-05-13 DIAGNOSIS — Z85828 Personal history of other malignant neoplasm of skin: Secondary | ICD-10-CM | POA: Diagnosis not present

## 2022-05-13 DIAGNOSIS — D485 Neoplasm of uncertain behavior of skin: Secondary | ICD-10-CM | POA: Diagnosis not present

## 2022-05-13 DIAGNOSIS — L821 Other seborrheic keratosis: Secondary | ICD-10-CM | POA: Diagnosis not present

## 2022-05-13 DIAGNOSIS — L814 Other melanin hyperpigmentation: Secondary | ICD-10-CM | POA: Diagnosis not present

## 2022-05-13 DIAGNOSIS — D2261 Melanocytic nevi of right upper limb, including shoulder: Secondary | ICD-10-CM | POA: Diagnosis not present

## 2022-05-13 DIAGNOSIS — L812 Freckles: Secondary | ICD-10-CM | POA: Diagnosis not present

## 2022-05-13 DIAGNOSIS — D2272 Melanocytic nevi of left lower limb, including hip: Secondary | ICD-10-CM | POA: Diagnosis not present

## 2022-05-30 ENCOUNTER — Encounter (INDEPENDENT_AMBULATORY_CARE_PROVIDER_SITE_OTHER): Payer: Medicare Other | Admitting: Ophthalmology

## 2022-05-30 DIAGNOSIS — H43813 Vitreous degeneration, bilateral: Secondary | ICD-10-CM | POA: Diagnosis not present

## 2022-05-30 DIAGNOSIS — H35372 Puckering of macula, left eye: Secondary | ICD-10-CM

## 2022-05-30 DIAGNOSIS — H353112 Nonexudative age-related macular degeneration, right eye, intermediate dry stage: Secondary | ICD-10-CM | POA: Diagnosis not present

## 2022-05-30 DIAGNOSIS — H353221 Exudative age-related macular degeneration, left eye, with active choroidal neovascularization: Secondary | ICD-10-CM

## 2022-06-26 DIAGNOSIS — E1143 Type 2 diabetes mellitus with diabetic autonomic (poly)neuropathy: Secondary | ICD-10-CM | POA: Diagnosis not present

## 2022-06-26 DIAGNOSIS — R600 Localized edema: Secondary | ICD-10-CM | POA: Diagnosis not present

## 2022-06-26 DIAGNOSIS — I25118 Atherosclerotic heart disease of native coronary artery with other forms of angina pectoris: Secondary | ICD-10-CM | POA: Diagnosis not present

## 2022-06-28 ENCOUNTER — Encounter (INDEPENDENT_AMBULATORY_CARE_PROVIDER_SITE_OTHER): Payer: Medicare Other | Admitting: Ophthalmology

## 2022-06-28 DIAGNOSIS — H353112 Nonexudative age-related macular degeneration, right eye, intermediate dry stage: Secondary | ICD-10-CM | POA: Diagnosis not present

## 2022-06-28 DIAGNOSIS — H353221 Exudative age-related macular degeneration, left eye, with active choroidal neovascularization: Secondary | ICD-10-CM

## 2022-06-28 DIAGNOSIS — H35372 Puckering of macula, left eye: Secondary | ICD-10-CM

## 2022-06-28 DIAGNOSIS — H43813 Vitreous degeneration, bilateral: Secondary | ICD-10-CM

## 2022-07-15 DIAGNOSIS — D696 Thrombocytopenia, unspecified: Secondary | ICD-10-CM | POA: Diagnosis not present

## 2022-07-15 DIAGNOSIS — E782 Mixed hyperlipidemia: Secondary | ICD-10-CM | POA: Diagnosis not present

## 2022-07-15 DIAGNOSIS — E1143 Type 2 diabetes mellitus with diabetic autonomic (poly)neuropathy: Secondary | ICD-10-CM | POA: Diagnosis not present

## 2022-07-17 DIAGNOSIS — K219 Gastro-esophageal reflux disease without esophagitis: Secondary | ICD-10-CM | POA: Diagnosis not present

## 2022-07-17 DIAGNOSIS — D692 Other nonthrombocytopenic purpura: Secondary | ICD-10-CM | POA: Diagnosis not present

## 2022-07-17 DIAGNOSIS — N182 Chronic kidney disease, stage 2 (mild): Secondary | ICD-10-CM | POA: Diagnosis not present

## 2022-07-17 DIAGNOSIS — E782 Mixed hyperlipidemia: Secondary | ICD-10-CM | POA: Diagnosis not present

## 2022-07-17 DIAGNOSIS — E1143 Type 2 diabetes mellitus with diabetic autonomic (poly)neuropathy: Secondary | ICD-10-CM | POA: Diagnosis not present

## 2022-07-17 DIAGNOSIS — D696 Thrombocytopenia, unspecified: Secondary | ICD-10-CM | POA: Diagnosis not present

## 2022-07-17 DIAGNOSIS — I10 Essential (primary) hypertension: Secondary | ICD-10-CM | POA: Diagnosis not present

## 2022-07-17 DIAGNOSIS — I25118 Atherosclerotic heart disease of native coronary artery with other forms of angina pectoris: Secondary | ICD-10-CM | POA: Diagnosis not present

## 2022-07-25 ENCOUNTER — Encounter (INDEPENDENT_AMBULATORY_CARE_PROVIDER_SITE_OTHER): Payer: Medicare Other | Admitting: Ophthalmology

## 2022-07-25 DIAGNOSIS — H353221 Exudative age-related macular degeneration, left eye, with active choroidal neovascularization: Secondary | ICD-10-CM

## 2022-07-25 DIAGNOSIS — H353112 Nonexudative age-related macular degeneration, right eye, intermediate dry stage: Secondary | ICD-10-CM

## 2022-07-25 DIAGNOSIS — H43813 Vitreous degeneration, bilateral: Secondary | ICD-10-CM

## 2022-07-25 DIAGNOSIS — H35372 Puckering of macula, left eye: Secondary | ICD-10-CM | POA: Diagnosis not present

## 2022-08-22 ENCOUNTER — Encounter (INDEPENDENT_AMBULATORY_CARE_PROVIDER_SITE_OTHER): Payer: Medicare Other | Admitting: Ophthalmology

## 2022-08-22 DIAGNOSIS — H43813 Vitreous degeneration, bilateral: Secondary | ICD-10-CM

## 2022-08-22 DIAGNOSIS — H353112 Nonexudative age-related macular degeneration, right eye, intermediate dry stage: Secondary | ICD-10-CM

## 2022-08-22 DIAGNOSIS — H353221 Exudative age-related macular degeneration, left eye, with active choroidal neovascularization: Secondary | ICD-10-CM | POA: Diagnosis not present

## 2022-08-22 DIAGNOSIS — H35372 Puckering of macula, left eye: Secondary | ICD-10-CM | POA: Diagnosis not present

## 2022-09-19 ENCOUNTER — Encounter (INDEPENDENT_AMBULATORY_CARE_PROVIDER_SITE_OTHER): Payer: Medicare Other | Admitting: Ophthalmology

## 2022-09-19 DIAGNOSIS — H353221 Exudative age-related macular degeneration, left eye, with active choroidal neovascularization: Secondary | ICD-10-CM | POA: Diagnosis not present

## 2022-09-19 DIAGNOSIS — H43813 Vitreous degeneration, bilateral: Secondary | ICD-10-CM

## 2022-09-19 DIAGNOSIS — H35372 Puckering of macula, left eye: Secondary | ICD-10-CM | POA: Diagnosis not present

## 2022-09-19 DIAGNOSIS — H353112 Nonexudative age-related macular degeneration, right eye, intermediate dry stage: Secondary | ICD-10-CM | POA: Diagnosis not present

## 2022-10-14 DIAGNOSIS — E1143 Type 2 diabetes mellitus with diabetic autonomic (poly)neuropathy: Secondary | ICD-10-CM | POA: Diagnosis not present

## 2022-10-14 DIAGNOSIS — E782 Mixed hyperlipidemia: Secondary | ICD-10-CM | POA: Diagnosis not present

## 2022-10-14 DIAGNOSIS — D696 Thrombocytopenia, unspecified: Secondary | ICD-10-CM | POA: Diagnosis not present

## 2022-10-16 DIAGNOSIS — Z139 Encounter for screening, unspecified: Secondary | ICD-10-CM | POA: Diagnosis not present

## 2022-10-16 DIAGNOSIS — E782 Mixed hyperlipidemia: Secondary | ICD-10-CM | POA: Diagnosis not present

## 2022-10-16 DIAGNOSIS — K219 Gastro-esophageal reflux disease without esophagitis: Secondary | ICD-10-CM | POA: Diagnosis not present

## 2022-10-16 DIAGNOSIS — N182 Chronic kidney disease, stage 2 (mild): Secondary | ICD-10-CM | POA: Diagnosis not present

## 2022-10-16 DIAGNOSIS — I10 Essential (primary) hypertension: Secondary | ICD-10-CM | POA: Diagnosis not present

## 2022-10-16 DIAGNOSIS — I25118 Atherosclerotic heart disease of native coronary artery with other forms of angina pectoris: Secondary | ICD-10-CM | POA: Diagnosis not present

## 2022-10-16 DIAGNOSIS — E1143 Type 2 diabetes mellitus with diabetic autonomic (poly)neuropathy: Secondary | ICD-10-CM | POA: Diagnosis not present

## 2022-10-16 DIAGNOSIS — D696 Thrombocytopenia, unspecified: Secondary | ICD-10-CM | POA: Diagnosis not present

## 2022-10-16 DIAGNOSIS — D692 Other nonthrombocytopenic purpura: Secondary | ICD-10-CM | POA: Diagnosis not present

## 2022-10-17 ENCOUNTER — Encounter (INDEPENDENT_AMBULATORY_CARE_PROVIDER_SITE_OTHER): Payer: Medicare Other | Admitting: Ophthalmology

## 2022-10-17 DIAGNOSIS — H43813 Vitreous degeneration, bilateral: Secondary | ICD-10-CM

## 2022-10-17 DIAGNOSIS — H353221 Exudative age-related macular degeneration, left eye, with active choroidal neovascularization: Secondary | ICD-10-CM

## 2022-10-17 DIAGNOSIS — H353112 Nonexudative age-related macular degeneration, right eye, intermediate dry stage: Secondary | ICD-10-CM

## 2022-10-17 DIAGNOSIS — H35372 Puckering of macula, left eye: Secondary | ICD-10-CM | POA: Diagnosis not present

## 2022-11-04 DIAGNOSIS — L821 Other seborrheic keratosis: Secondary | ICD-10-CM | POA: Diagnosis not present

## 2022-11-04 DIAGNOSIS — Z85828 Personal history of other malignant neoplasm of skin: Secondary | ICD-10-CM | POA: Diagnosis not present

## 2022-11-04 DIAGNOSIS — D3613 Benign neoplasm of peripheral nerves and autonomic nervous system of lower limb, including hip: Secondary | ICD-10-CM | POA: Diagnosis not present

## 2022-11-04 DIAGNOSIS — D225 Melanocytic nevi of trunk: Secondary | ICD-10-CM | POA: Diagnosis not present

## 2022-11-04 DIAGNOSIS — L570 Actinic keratosis: Secondary | ICD-10-CM | POA: Diagnosis not present

## 2022-11-04 DIAGNOSIS — D1801 Hemangioma of skin and subcutaneous tissue: Secondary | ICD-10-CM | POA: Diagnosis not present

## 2022-11-14 ENCOUNTER — Encounter (INDEPENDENT_AMBULATORY_CARE_PROVIDER_SITE_OTHER): Payer: Medicare Other | Admitting: Ophthalmology

## 2022-11-14 DIAGNOSIS — H43813 Vitreous degeneration, bilateral: Secondary | ICD-10-CM | POA: Diagnosis not present

## 2022-11-14 DIAGNOSIS — H35372 Puckering of macula, left eye: Secondary | ICD-10-CM

## 2022-11-14 DIAGNOSIS — H353112 Nonexudative age-related macular degeneration, right eye, intermediate dry stage: Secondary | ICD-10-CM | POA: Diagnosis not present

## 2022-11-14 DIAGNOSIS — H353221 Exudative age-related macular degeneration, left eye, with active choroidal neovascularization: Secondary | ICD-10-CM | POA: Diagnosis not present

## 2022-12-12 ENCOUNTER — Encounter (INDEPENDENT_AMBULATORY_CARE_PROVIDER_SITE_OTHER): Payer: Medicare Other | Admitting: Ophthalmology

## 2022-12-12 DIAGNOSIS — H353112 Nonexudative age-related macular degeneration, right eye, intermediate dry stage: Secondary | ICD-10-CM

## 2022-12-12 DIAGNOSIS — H353221 Exudative age-related macular degeneration, left eye, with active choroidal neovascularization: Secondary | ICD-10-CM

## 2022-12-12 DIAGNOSIS — H43813 Vitreous degeneration, bilateral: Secondary | ICD-10-CM | POA: Diagnosis not present

## 2023-01-09 ENCOUNTER — Encounter (INDEPENDENT_AMBULATORY_CARE_PROVIDER_SITE_OTHER): Payer: Medicare Other | Admitting: Ophthalmology

## 2023-01-09 DIAGNOSIS — H353221 Exudative age-related macular degeneration, left eye, with active choroidal neovascularization: Secondary | ICD-10-CM

## 2023-01-09 DIAGNOSIS — H43813 Vitreous degeneration, bilateral: Secondary | ICD-10-CM

## 2023-01-09 DIAGNOSIS — H35373 Puckering of macula, bilateral: Secondary | ICD-10-CM

## 2023-01-09 DIAGNOSIS — H353112 Nonexudative age-related macular degeneration, right eye, intermediate dry stage: Secondary | ICD-10-CM | POA: Diagnosis not present

## 2023-01-27 DIAGNOSIS — E782 Mixed hyperlipidemia: Secondary | ICD-10-CM | POA: Diagnosis not present

## 2023-01-27 DIAGNOSIS — Z79899 Other long term (current) drug therapy: Secondary | ICD-10-CM | POA: Diagnosis not present

## 2023-01-27 DIAGNOSIS — D696 Thrombocytopenia, unspecified: Secondary | ICD-10-CM | POA: Diagnosis not present

## 2023-01-27 DIAGNOSIS — E1143 Type 2 diabetes mellitus with diabetic autonomic (poly)neuropathy: Secondary | ICD-10-CM | POA: Diagnosis not present

## 2023-01-29 DIAGNOSIS — N182 Chronic kidney disease, stage 2 (mild): Secondary | ICD-10-CM | POA: Diagnosis not present

## 2023-01-29 DIAGNOSIS — I25118 Atherosclerotic heart disease of native coronary artery with other forms of angina pectoris: Secondary | ICD-10-CM | POA: Diagnosis not present

## 2023-01-29 DIAGNOSIS — E1143 Type 2 diabetes mellitus with diabetic autonomic (poly)neuropathy: Secondary | ICD-10-CM | POA: Diagnosis not present

## 2023-01-29 DIAGNOSIS — I10 Essential (primary) hypertension: Secondary | ICD-10-CM | POA: Diagnosis not present

## 2023-01-29 DIAGNOSIS — E782 Mixed hyperlipidemia: Secondary | ICD-10-CM | POA: Diagnosis not present

## 2023-01-29 DIAGNOSIS — Z9181 History of falling: Secondary | ICD-10-CM | POA: Diagnosis not present

## 2023-01-29 DIAGNOSIS — D692 Other nonthrombocytopenic purpura: Secondary | ICD-10-CM | POA: Diagnosis not present

## 2023-01-29 DIAGNOSIS — D696 Thrombocytopenia, unspecified: Secondary | ICD-10-CM | POA: Diagnosis not present

## 2023-01-29 DIAGNOSIS — K219 Gastro-esophageal reflux disease without esophagitis: Secondary | ICD-10-CM | POA: Diagnosis not present

## 2023-02-06 ENCOUNTER — Encounter (INDEPENDENT_AMBULATORY_CARE_PROVIDER_SITE_OTHER): Payer: Medicare Other | Admitting: Ophthalmology

## 2023-02-06 DIAGNOSIS — H353221 Exudative age-related macular degeneration, left eye, with active choroidal neovascularization: Secondary | ICD-10-CM | POA: Diagnosis not present

## 2023-02-06 DIAGNOSIS — H353112 Nonexudative age-related macular degeneration, right eye, intermediate dry stage: Secondary | ICD-10-CM | POA: Diagnosis not present

## 2023-02-06 DIAGNOSIS — H43813 Vitreous degeneration, bilateral: Secondary | ICD-10-CM

## 2023-02-06 DIAGNOSIS — H35372 Puckering of macula, left eye: Secondary | ICD-10-CM

## 2023-03-06 ENCOUNTER — Encounter (INDEPENDENT_AMBULATORY_CARE_PROVIDER_SITE_OTHER): Payer: Medicare Other | Admitting: Ophthalmology

## 2023-03-06 DIAGNOSIS — H353111 Nonexudative age-related macular degeneration, right eye, early dry stage: Secondary | ICD-10-CM | POA: Diagnosis not present

## 2023-03-06 DIAGNOSIS — H353221 Exudative age-related macular degeneration, left eye, with active choroidal neovascularization: Secondary | ICD-10-CM

## 2023-03-06 DIAGNOSIS — H43813 Vitreous degeneration, bilateral: Secondary | ICD-10-CM

## 2023-04-10 ENCOUNTER — Encounter: Payer: Self-pay | Admitting: Cardiology

## 2023-04-10 ENCOUNTER — Ambulatory Visit: Payer: Medicare Other | Attending: Cardiology | Admitting: Cardiology

## 2023-04-10 VITALS — BP 110/58 | HR 58 | Ht 72.0 in | Wt 230.0 lb

## 2023-04-10 DIAGNOSIS — E1121 Type 2 diabetes mellitus with diabetic nephropathy: Secondary | ICD-10-CM | POA: Diagnosis not present

## 2023-04-10 DIAGNOSIS — I452 Bifascicular block: Secondary | ICD-10-CM | POA: Diagnosis not present

## 2023-04-10 DIAGNOSIS — I10 Essential (primary) hypertension: Secondary | ICD-10-CM

## 2023-04-10 DIAGNOSIS — I25119 Atherosclerotic heart disease of native coronary artery with unspecified angina pectoris: Secondary | ICD-10-CM | POA: Diagnosis not present

## 2023-04-10 DIAGNOSIS — E782 Mixed hyperlipidemia: Secondary | ICD-10-CM | POA: Diagnosis not present

## 2023-04-10 NOTE — Progress Notes (Signed)
Cardiology Office Note:  .   Date:  04/10/2023  ID:  Eric Carter, DOB [birth year], MRN [id number] PCP: Ailene Ravel, MD  [facility] Providers Cardiologist:  Donato Schultz, MD    History of Present Illness: .   Eric Carter is a 89 y.o. male Discussed with the use of AI scribe.   History of Present Illness   The patient is an 89 year old male with a history of coronary artery disease, hyperlipidemia, and obstructive sleep apnea. He had an acute inferior STEMI in 2007, treated with PCI to the RCA. A recent heart catheterization revealed a 75% stenosis of the proximal RCA, which was treated with DES by Dr. Herbie Baltimore. The patient also has diffuse LAD disease, which is being managed medically. He is currently on aspirin, Plavix, simvastatin.  The patient reports general well-being, with no significant pain. However, he has been experiencing muscle discomfort in his legs, which he suspects may be due to his simvastatin medication. He has not tried discontinuing the medication to see if the discomfort subsides.  The patient also reports fatigue when walking, needing to stop and rest after short distances. He has noticed some instability in his right knee, which occasionally gives way. Despite these challenges, the patient remains active, doing housework and using a treadmill and bicycle for exercise.  The patient has been managing his sleep apnea with a CPAP machine, but discontinued its use a few years ago as he found it too taxing. He is also on metformin for diabetes, with a recent A1c of 8.5.  The patient's personal life has been marked by recent loss and illness in the family. His wife, who had an autoimmune liver disease, passed away in 10-19-22. The patient also has a brother and sister with dementia, and a daughter who suffered a hemorrhagic stroke at age 53. These events have caused significant emotional distress for the patient.   His grandson has visited recently and help with  yard work.  He lives in [place].           Studies Reviewed: Marland Kitchen   EKG Interpretation Date/Time:  Thursday April 10 2023 11:05:45 EDT Ventricular Rate:  58 PR Interval:  264 QRS Duration:  124 QT Interval:  444 QTC Calculation: 435 R Axis:   -64  Text Interpretation: Sinus bradycardia with 1st degree A-V block Right bundle branch block Left axis deviation RSR' or QR pattern in V1 suggests right ventricular conduction delay Inferior infarct (cited on or before 28-Feb-2006) Anterolateral infarct , age undetermined When compared with ECG of 30-Apr-2017 04:51, RSR' pattern in V1 has replaced Right bundle branch block Anterior infarct is now Present Anterolateral infarct is now Present (possible lead placement) Confirmed by Donato Schultz ([id number]) on 04/10/2023 11:15:04 AM    Results LABS LDL: 75 HbA1c: 8.5 Cr: 1.07 Hb: 13  DIAGNOSTIC Left heart catheterization: 75% stenosis of proximal RCA treated with DES, diffuse LAD disease treated medically (04/29/2017) EKG: First-degree AV block, right bundle branch block, anterolateral infarct pattern  Risk Assessment/Calculations:            Physical Exam:   VS:  BP (!) 110/58   Pulse (!) 58   Ht 6' (1.829 m)   Wt 230 lb (104.3 kg)   SpO2 94%   BMI 31.19 kg/m    Wt Readings from Last 3 Encounters:  04/10/23 230 lb (104.3 kg)  03/25/22 229 lb (103.9 kg)  10/18/2020 220 lb 12.8 oz (100.2 kg)    GEN: Well  nourished, well developed in no acute distress NECK: No JVD; No carotid bruits CARDIAC: RRR, no murmurs, no rubs, no gallops RESPIRATORY:  Clear to auscultation without rales, wheezing or rhonchi  ABDOMEN: Soft, non-tender, non-distended EXTREMITIES:  No edema; No deformity   ASSESSMENT AND PLAN: .    Assessment and Plan    Coronary Artery Disease History of acute inferior STEMI treated with PCI to RCA in 2007. Recent catheterization 2018 revealed 75% stenosis of proximal RCA treated with DES. Diffuse LAD disease being managed  medically. Currently on aspirin, Plavix, statin, and ARB. -Continue current medications.  Hyperlipidemia On Zetia and simvastatin. Recent LDL was 75. Patient reports muscle discomfort potentially related to simvastatin use. -Trial discontinuation of simvastatin for 2 weeks to assess for improvement in muscle discomfort. -If improvement noted, consider alternative statin or lipid-lowering therapy.  Obstructive Sleep Apnea Previously on CPAP, but no longer using. -No changes recommended at this time.  General Health Maintenance -Continue metformin for diabetes management. -Continue isosorbide for angina prevention. -Follow-up in 1 year, or sooner if needed.             Signed, Donato Schultz, MD

## 2023-04-10 NOTE — Patient Instructions (Signed)
Medication Instructions:  Please hold your Simvastatin for 2 weeks to see if any improvement in leg pain.  Let us know how you are feeling after holding it. Continue all other medications as listed. *If you need a refill on your cardiac medications before your next appointment, please call your pharmacy*  Follow-Up: At [facility], you and your health needs are our priority.  As part of our continuing mission to provide you with exceptional heart care, we have created designated Provider Care Teams.  These Care Teams include your primary Cardiologist (physician) and Advanced Practice Providers (APPs -  Physician Assistants and Nurse Practitioners) who all work together to provide you with the care you need, when you need it.  We recommend signing up for the patient portal called "MyChart".  Sign up information is provided on this After Visit Summary.  MyChart is used to connect with patients for Virtual Visits (Telemedicine).  Patients are able to view lab/test results, encounter notes, upcoming appointments, etc.  Non-urgent messages can be sent to your provider as well.   To learn more about what you can do with MyChart, go to [email or web address].    Your next appointment:   1 year(s)  Provider:   Donato Schultz, MD

## 2023-04-11 ENCOUNTER — Encounter (INDEPENDENT_AMBULATORY_CARE_PROVIDER_SITE_OTHER): Payer: Medicare Other | Admitting: Ophthalmology

## 2023-04-11 DIAGNOSIS — H353112 Nonexudative age-related macular degeneration, right eye, intermediate dry stage: Secondary | ICD-10-CM | POA: Diagnosis not present

## 2023-04-11 DIAGNOSIS — H353221 Exudative age-related macular degeneration, left eye, with active choroidal neovascularization: Secondary | ICD-10-CM | POA: Diagnosis not present

## 2023-04-11 DIAGNOSIS — H43813 Vitreous degeneration, bilateral: Secondary | ICD-10-CM | POA: Diagnosis not present

## 2023-04-11 DIAGNOSIS — H35372 Puckering of macula, left eye: Secondary | ICD-10-CM | POA: Diagnosis not present

## 2023-05-13 ENCOUNTER — Encounter (INDEPENDENT_AMBULATORY_CARE_PROVIDER_SITE_OTHER): Payer: Medicare Other | Admitting: Ophthalmology

## 2023-05-13 DIAGNOSIS — H353221 Exudative age-related macular degeneration, left eye, with active choroidal neovascularization: Secondary | ICD-10-CM

## 2023-05-13 DIAGNOSIS — H43813 Vitreous degeneration, bilateral: Secondary | ICD-10-CM | POA: Diagnosis not present

## 2023-05-13 DIAGNOSIS — H353112 Nonexudative age-related macular degeneration, right eye, intermediate dry stage: Secondary | ICD-10-CM

## 2023-05-13 DIAGNOSIS — H35372 Puckering of macula, left eye: Secondary | ICD-10-CM | POA: Diagnosis not present

## 2023-05-29 DIAGNOSIS — Z139 Encounter for screening, unspecified: Secondary | ICD-10-CM | POA: Diagnosis not present

## 2023-05-29 DIAGNOSIS — Z9181 History of falling: Secondary | ICD-10-CM | POA: Diagnosis not present

## 2023-05-29 DIAGNOSIS — Z0000 Encounter for general adult medical examination without abnormal findings: Secondary | ICD-10-CM | POA: Diagnosis not present

## 2023-06-02 DIAGNOSIS — D696 Thrombocytopenia, unspecified: Secondary | ICD-10-CM | POA: Diagnosis not present

## 2023-06-02 DIAGNOSIS — E782 Mixed hyperlipidemia: Secondary | ICD-10-CM | POA: Diagnosis not present

## 2023-06-02 DIAGNOSIS — E1143 Type 2 diabetes mellitus with diabetic autonomic (poly)neuropathy: Secondary | ICD-10-CM | POA: Diagnosis not present

## 2023-06-04 DIAGNOSIS — E782 Mixed hyperlipidemia: Secondary | ICD-10-CM | POA: Diagnosis not present

## 2023-06-04 DIAGNOSIS — I25118 Atherosclerotic heart disease of native coronary artery with other forms of angina pectoris: Secondary | ICD-10-CM | POA: Diagnosis not present

## 2023-06-04 DIAGNOSIS — E1143 Type 2 diabetes mellitus with diabetic autonomic (poly)neuropathy: Secondary | ICD-10-CM | POA: Diagnosis not present

## 2023-06-04 DIAGNOSIS — N182 Chronic kidney disease, stage 2 (mild): Secondary | ICD-10-CM | POA: Diagnosis not present

## 2023-06-04 DIAGNOSIS — I10 Essential (primary) hypertension: Secondary | ICD-10-CM | POA: Diagnosis not present

## 2023-06-04 DIAGNOSIS — K219 Gastro-esophageal reflux disease without esophagitis: Secondary | ICD-10-CM | POA: Diagnosis not present

## 2023-06-04 DIAGNOSIS — D696 Thrombocytopenia, unspecified: Secondary | ICD-10-CM | POA: Diagnosis not present

## 2023-06-19 ENCOUNTER — Encounter (INDEPENDENT_AMBULATORY_CARE_PROVIDER_SITE_OTHER): Payer: Medicare Other | Admitting: Ophthalmology

## 2023-06-19 DIAGNOSIS — H353221 Exudative age-related macular degeneration, left eye, with active choroidal neovascularization: Secondary | ICD-10-CM | POA: Diagnosis not present

## 2023-06-19 DIAGNOSIS — H353112 Nonexudative age-related macular degeneration, right eye, intermediate dry stage: Secondary | ICD-10-CM | POA: Diagnosis not present

## 2023-06-19 DIAGNOSIS — H43813 Vitreous degeneration, bilateral: Secondary | ICD-10-CM | POA: Diagnosis not present

## 2023-07-17 ENCOUNTER — Encounter (INDEPENDENT_AMBULATORY_CARE_PROVIDER_SITE_OTHER): Payer: Medicare Other | Admitting: Ophthalmology

## 2023-07-17 DIAGNOSIS — H43813 Vitreous degeneration, bilateral: Secondary | ICD-10-CM

## 2023-07-17 DIAGNOSIS — H353112 Nonexudative age-related macular degeneration, right eye, intermediate dry stage: Secondary | ICD-10-CM | POA: Diagnosis not present

## 2023-07-17 DIAGNOSIS — H353221 Exudative age-related macular degeneration, left eye, with active choroidal neovascularization: Secondary | ICD-10-CM

## 2023-08-14 ENCOUNTER — Encounter (INDEPENDENT_AMBULATORY_CARE_PROVIDER_SITE_OTHER): Payer: Medicare Other | Admitting: Ophthalmology

## 2023-08-14 DIAGNOSIS — H353221 Exudative age-related macular degeneration, left eye, with active choroidal neovascularization: Secondary | ICD-10-CM | POA: Diagnosis not present

## 2023-08-14 DIAGNOSIS — H43813 Vitreous degeneration, bilateral: Secondary | ICD-10-CM

## 2023-08-14 DIAGNOSIS — H35372 Puckering of macula, left eye: Secondary | ICD-10-CM

## 2023-08-14 DIAGNOSIS — H353112 Nonexudative age-related macular degeneration, right eye, intermediate dry stage: Secondary | ICD-10-CM | POA: Diagnosis not present

## 2023-09-11 ENCOUNTER — Encounter (INDEPENDENT_AMBULATORY_CARE_PROVIDER_SITE_OTHER): Payer: Medicare Other | Admitting: Ophthalmology

## 2023-09-11 DIAGNOSIS — H43813 Vitreous degeneration, bilateral: Secondary | ICD-10-CM

## 2023-09-11 DIAGNOSIS — H353112 Nonexudative age-related macular degeneration, right eye, intermediate dry stage: Secondary | ICD-10-CM

## 2023-09-11 DIAGNOSIS — H35372 Puckering of macula, left eye: Secondary | ICD-10-CM

## 2023-09-11 DIAGNOSIS — H353221 Exudative age-related macular degeneration, left eye, with active choroidal neovascularization: Secondary | ICD-10-CM | POA: Diagnosis not present

## 2023-10-06 DIAGNOSIS — E782 Mixed hyperlipidemia: Secondary | ICD-10-CM | POA: Diagnosis not present

## 2023-10-06 DIAGNOSIS — D696 Thrombocytopenia, unspecified: Secondary | ICD-10-CM | POA: Diagnosis not present

## 2023-10-06 DIAGNOSIS — E1143 Type 2 diabetes mellitus with diabetic autonomic (poly)neuropathy: Secondary | ICD-10-CM | POA: Diagnosis not present

## 2023-10-08 ENCOUNTER — Encounter (INDEPENDENT_AMBULATORY_CARE_PROVIDER_SITE_OTHER): Admitting: Ophthalmology

## 2023-10-08 DIAGNOSIS — E1143 Type 2 diabetes mellitus with diabetic autonomic (poly)neuropathy: Secondary | ICD-10-CM | POA: Diagnosis not present

## 2023-10-08 DIAGNOSIS — I10 Essential (primary) hypertension: Secondary | ICD-10-CM | POA: Diagnosis not present

## 2023-10-08 DIAGNOSIS — D692 Other nonthrombocytopenic purpura: Secondary | ICD-10-CM | POA: Diagnosis not present

## 2023-10-08 DIAGNOSIS — K219 Gastro-esophageal reflux disease without esophagitis: Secondary | ICD-10-CM | POA: Diagnosis not present

## 2023-10-08 DIAGNOSIS — Z23 Encounter for immunization: Secondary | ICD-10-CM | POA: Diagnosis not present

## 2023-10-08 DIAGNOSIS — D696 Thrombocytopenia, unspecified: Secondary | ICD-10-CM | POA: Diagnosis not present

## 2023-10-08 DIAGNOSIS — E782 Mixed hyperlipidemia: Secondary | ICD-10-CM | POA: Diagnosis not present

## 2023-10-08 DIAGNOSIS — I25118 Atherosclerotic heart disease of native coronary artery with other forms of angina pectoris: Secondary | ICD-10-CM | POA: Diagnosis not present

## 2023-10-08 DIAGNOSIS — N182 Chronic kidney disease, stage 2 (mild): Secondary | ICD-10-CM | POA: Diagnosis not present

## 2023-10-10 ENCOUNTER — Encounter (INDEPENDENT_AMBULATORY_CARE_PROVIDER_SITE_OTHER): Admitting: Ophthalmology

## 2023-10-10 DIAGNOSIS — H353112 Nonexudative age-related macular degeneration, right eye, intermediate dry stage: Secondary | ICD-10-CM | POA: Diagnosis not present

## 2023-10-10 DIAGNOSIS — H35372 Puckering of macula, left eye: Secondary | ICD-10-CM

## 2023-10-10 DIAGNOSIS — H43813 Vitreous degeneration, bilateral: Secondary | ICD-10-CM | POA: Diagnosis not present

## 2023-10-10 DIAGNOSIS — H353221 Exudative age-related macular degeneration, left eye, with active choroidal neovascularization: Secondary | ICD-10-CM | POA: Diagnosis not present

## 2023-11-13 ENCOUNTER — Encounter (INDEPENDENT_AMBULATORY_CARE_PROVIDER_SITE_OTHER): Admitting: Ophthalmology

## 2023-11-13 DIAGNOSIS — H353112 Nonexudative age-related macular degeneration, right eye, intermediate dry stage: Secondary | ICD-10-CM

## 2023-11-13 DIAGNOSIS — H35372 Puckering of macula, left eye: Secondary | ICD-10-CM

## 2023-11-13 DIAGNOSIS — H353221 Exudative age-related macular degeneration, left eye, with active choroidal neovascularization: Secondary | ICD-10-CM

## 2023-11-13 DIAGNOSIS — H43813 Vitreous degeneration, bilateral: Secondary | ICD-10-CM | POA: Diagnosis not present

## 2023-11-19 DIAGNOSIS — D692 Other nonthrombocytopenic purpura: Secondary | ICD-10-CM | POA: Diagnosis not present

## 2023-11-19 DIAGNOSIS — D1801 Hemangioma of skin and subcutaneous tissue: Secondary | ICD-10-CM | POA: Diagnosis not present

## 2023-11-19 DIAGNOSIS — L821 Other seborrheic keratosis: Secondary | ICD-10-CM | POA: Diagnosis not present

## 2023-11-19 DIAGNOSIS — L570 Actinic keratosis: Secondary | ICD-10-CM | POA: Diagnosis not present

## 2023-11-19 DIAGNOSIS — L812 Freckles: Secondary | ICD-10-CM | POA: Diagnosis not present

## 2023-11-19 DIAGNOSIS — D225 Melanocytic nevi of trunk: Secondary | ICD-10-CM | POA: Diagnosis not present

## 2023-11-19 DIAGNOSIS — D3613 Benign neoplasm of peripheral nerves and autonomic nervous system of lower limb, including hip: Secondary | ICD-10-CM | POA: Diagnosis not present

## 2023-11-19 DIAGNOSIS — Z85828 Personal history of other malignant neoplasm of skin: Secondary | ICD-10-CM | POA: Diagnosis not present

## 2023-12-11 ENCOUNTER — Encounter (INDEPENDENT_AMBULATORY_CARE_PROVIDER_SITE_OTHER): Admitting: Ophthalmology

## 2023-12-11 DIAGNOSIS — H43813 Vitreous degeneration, bilateral: Secondary | ICD-10-CM | POA: Diagnosis not present

## 2023-12-11 DIAGNOSIS — H353112 Nonexudative age-related macular degeneration, right eye, intermediate dry stage: Secondary | ICD-10-CM | POA: Diagnosis not present

## 2023-12-11 DIAGNOSIS — H35372 Puckering of macula, left eye: Secondary | ICD-10-CM

## 2023-12-11 DIAGNOSIS — H353221 Exudative age-related macular degeneration, left eye, with active choroidal neovascularization: Secondary | ICD-10-CM | POA: Diagnosis not present

## 2024-01-08 ENCOUNTER — Encounter (INDEPENDENT_AMBULATORY_CARE_PROVIDER_SITE_OTHER): Admitting: Ophthalmology

## 2024-01-08 DIAGNOSIS — H353221 Exudative age-related macular degeneration, left eye, with active choroidal neovascularization: Secondary | ICD-10-CM

## 2024-01-08 DIAGNOSIS — H43813 Vitreous degeneration, bilateral: Secondary | ICD-10-CM

## 2024-01-08 DIAGNOSIS — H353112 Nonexudative age-related macular degeneration, right eye, intermediate dry stage: Secondary | ICD-10-CM | POA: Diagnosis not present

## 2024-01-08 DIAGNOSIS — H35372 Puckering of macula, left eye: Secondary | ICD-10-CM

## 2024-02-05 ENCOUNTER — Encounter (INDEPENDENT_AMBULATORY_CARE_PROVIDER_SITE_OTHER): Admitting: Ophthalmology

## 2024-02-05 DIAGNOSIS — H35372 Puckering of macula, left eye: Secondary | ICD-10-CM

## 2024-02-05 DIAGNOSIS — H353221 Exudative age-related macular degeneration, left eye, with active choroidal neovascularization: Secondary | ICD-10-CM

## 2024-02-05 DIAGNOSIS — H353112 Nonexudative age-related macular degeneration, right eye, intermediate dry stage: Secondary | ICD-10-CM | POA: Diagnosis not present

## 2024-02-05 DIAGNOSIS — H43813 Vitreous degeneration, bilateral: Secondary | ICD-10-CM | POA: Diagnosis not present

## 2024-02-09 DIAGNOSIS — D696 Thrombocytopenia, unspecified: Secondary | ICD-10-CM | POA: Diagnosis not present

## 2024-02-09 DIAGNOSIS — Z79899 Other long term (current) drug therapy: Secondary | ICD-10-CM | POA: Diagnosis not present

## 2024-02-09 DIAGNOSIS — E782 Mixed hyperlipidemia: Secondary | ICD-10-CM | POA: Diagnosis not present

## 2024-02-09 DIAGNOSIS — E1143 Type 2 diabetes mellitus with diabetic autonomic (poly)neuropathy: Secondary | ICD-10-CM | POA: Diagnosis not present

## 2024-02-19 DIAGNOSIS — R0781 Pleurodynia: Secondary | ICD-10-CM | POA: Diagnosis not present

## 2024-02-19 DIAGNOSIS — I25118 Atherosclerotic heart disease of native coronary artery with other forms of angina pectoris: Secondary | ICD-10-CM | POA: Diagnosis not present

## 2024-02-19 DIAGNOSIS — E1143 Type 2 diabetes mellitus with diabetic autonomic (poly)neuropathy: Secondary | ICD-10-CM | POA: Diagnosis not present

## 2024-02-19 DIAGNOSIS — N182 Chronic kidney disease, stage 2 (mild): Secondary | ICD-10-CM | POA: Diagnosis not present

## 2024-02-19 DIAGNOSIS — E782 Mixed hyperlipidemia: Secondary | ICD-10-CM | POA: Diagnosis not present

## 2024-02-19 DIAGNOSIS — K219 Gastro-esophageal reflux disease without esophagitis: Secondary | ICD-10-CM | POA: Diagnosis not present

## 2024-02-19 DIAGNOSIS — D696 Thrombocytopenia, unspecified: Secondary | ICD-10-CM | POA: Diagnosis not present

## 2024-02-19 DIAGNOSIS — I10 Essential (primary) hypertension: Secondary | ICD-10-CM | POA: Diagnosis not present

## 2024-02-24 DIAGNOSIS — R0781 Pleurodynia: Secondary | ICD-10-CM | POA: Diagnosis not present

## 2024-03-04 ENCOUNTER — Encounter (INDEPENDENT_AMBULATORY_CARE_PROVIDER_SITE_OTHER): Admitting: Ophthalmology

## 2024-03-04 DIAGNOSIS — H43813 Vitreous degeneration, bilateral: Secondary | ICD-10-CM | POA: Diagnosis not present

## 2024-03-04 DIAGNOSIS — H353112 Nonexudative age-related macular degeneration, right eye, intermediate dry stage: Secondary | ICD-10-CM | POA: Diagnosis not present

## 2024-03-04 DIAGNOSIS — H353221 Exudative age-related macular degeneration, left eye, with active choroidal neovascularization: Secondary | ICD-10-CM

## 2024-04-08 ENCOUNTER — Encounter (INDEPENDENT_AMBULATORY_CARE_PROVIDER_SITE_OTHER): Admitting: Ophthalmology

## 2024-04-08 DIAGNOSIS — H353221 Exudative age-related macular degeneration, left eye, with active choroidal neovascularization: Secondary | ICD-10-CM

## 2024-04-08 DIAGNOSIS — H353112 Nonexudative age-related macular degeneration, right eye, intermediate dry stage: Secondary | ICD-10-CM | POA: Diagnosis not present

## 2024-04-08 DIAGNOSIS — H35372 Puckering of macula, left eye: Secondary | ICD-10-CM | POA: Diagnosis not present

## 2024-04-08 DIAGNOSIS — H43813 Vitreous degeneration, bilateral: Secondary | ICD-10-CM | POA: Diagnosis not present

## 2024-04-09 DIAGNOSIS — M542 Cervicalgia: Secondary | ICD-10-CM | POA: Diagnosis not present

## 2024-04-09 DIAGNOSIS — M5030 Other cervical disc degeneration, unspecified cervical region: Secondary | ICD-10-CM | POA: Diagnosis not present

## 2024-04-09 DIAGNOSIS — M19011 Primary osteoarthritis, right shoulder: Secondary | ICD-10-CM | POA: Diagnosis not present

## 2024-04-09 DIAGNOSIS — M47812 Spondylosis without myelopathy or radiculopathy, cervical region: Secondary | ICD-10-CM | POA: Diagnosis not present

## 2024-04-09 DIAGNOSIS — G8929 Other chronic pain: Secondary | ICD-10-CM | POA: Diagnosis not present

## 2024-04-09 DIAGNOSIS — M25511 Pain in right shoulder: Secondary | ICD-10-CM | POA: Diagnosis not present

## 2024-04-14 DIAGNOSIS — M7711 Lateral epicondylitis, right elbow: Secondary | ICD-10-CM | POA: Diagnosis not present

## 2024-04-14 DIAGNOSIS — M5412 Radiculopathy, cervical region: Secondary | ICD-10-CM | POA: Diagnosis not present

## 2024-05-10 ENCOUNTER — Encounter (INDEPENDENT_AMBULATORY_CARE_PROVIDER_SITE_OTHER): Admitting: Ophthalmology

## 2024-05-10 DIAGNOSIS — H43813 Vitreous degeneration, bilateral: Secondary | ICD-10-CM

## 2024-05-10 DIAGNOSIS — H353112 Nonexudative age-related macular degeneration, right eye, intermediate dry stage: Secondary | ICD-10-CM | POA: Diagnosis not present

## 2024-05-10 DIAGNOSIS — H353221 Exudative age-related macular degeneration, left eye, with active choroidal neovascularization: Secondary | ICD-10-CM

## 2024-06-07 ENCOUNTER — Encounter (INDEPENDENT_AMBULATORY_CARE_PROVIDER_SITE_OTHER): Admitting: Ophthalmology

## 2024-06-07 DIAGNOSIS — H43813 Vitreous degeneration, bilateral: Secondary | ICD-10-CM | POA: Diagnosis not present

## 2024-06-07 DIAGNOSIS — H35372 Puckering of macula, left eye: Secondary | ICD-10-CM | POA: Diagnosis not present

## 2024-06-07 DIAGNOSIS — H353112 Nonexudative age-related macular degeneration, right eye, intermediate dry stage: Secondary | ICD-10-CM

## 2024-06-07 DIAGNOSIS — H353221 Exudative age-related macular degeneration, left eye, with active choroidal neovascularization: Secondary | ICD-10-CM

## 2024-07-15 ENCOUNTER — Encounter (INDEPENDENT_AMBULATORY_CARE_PROVIDER_SITE_OTHER): Admitting: Ophthalmology

## 2024-07-15 DIAGNOSIS — H35372 Puckering of macula, left eye: Secondary | ICD-10-CM

## 2024-07-15 DIAGNOSIS — H353221 Exudative age-related macular degeneration, left eye, with active choroidal neovascularization: Secondary | ICD-10-CM

## 2024-07-15 DIAGNOSIS — H43813 Vitreous degeneration, bilateral: Secondary | ICD-10-CM | POA: Diagnosis not present

## 2024-07-15 DIAGNOSIS — H353112 Nonexudative age-related macular degeneration, right eye, intermediate dry stage: Secondary | ICD-10-CM

## 2024-08-19 ENCOUNTER — Encounter (INDEPENDENT_AMBULATORY_CARE_PROVIDER_SITE_OTHER): Admitting: Ophthalmology
